# Patient Record
Sex: Female | Born: 1975 | Race: White | Hispanic: No | Smoking: Current every day smoker
Health system: Southern US, Community
[De-identification: ages and names within clinical notes are randomized; demographics above are authoritative.]

## PROBLEM LIST (undated history)

## (undated) DIAGNOSIS — K8020 Calculus of gallbladder without cholecystitis without obstruction: Secondary | ICD-10-CM

## (undated) DIAGNOSIS — I10 Essential (primary) hypertension: Secondary | ICD-10-CM

## (undated) DIAGNOSIS — D219 Benign neoplasm of connective and other soft tissue, unspecified: Secondary | ICD-10-CM

## (undated) DIAGNOSIS — F17200 Nicotine dependence, unspecified, uncomplicated: Secondary | ICD-10-CM

## (undated) DIAGNOSIS — D649 Anemia, unspecified: Secondary | ICD-10-CM

## (undated) DIAGNOSIS — M1990 Unspecified osteoarthritis, unspecified site: Secondary | ICD-10-CM

## (undated) HISTORY — DX: Unspecified osteoarthritis, unspecified site: M19.90

## (undated) HISTORY — DX: Benign neoplasm of connective and other soft tissue, unspecified: D21.9

## (undated) HISTORY — DX: Nicotine dependence, unspecified, uncomplicated: F17.200

## (undated) HISTORY — DX: Calculus of gallbladder without cholecystitis without obstruction: K80.20

## (undated) HISTORY — DX: Essential (primary) hypertension: I10

---

## 2007-07-01 HISTORY — PX: HERNIA REPAIR: SHX51

## 2019-07-19 ENCOUNTER — Emergency Department (HOSPITAL_COMMUNITY)
Admission: EM | Admit: 2019-07-19 | Discharge: 2019-07-19 | Disposition: A | Discharge status: Home / Self Care | Payer: Self-pay | Attending: Emergency Medicine | Admitting: Emergency Medicine

## 2019-07-19 ENCOUNTER — Other Ambulatory Visit: Payer: Self-pay

## 2019-07-19 DIAGNOSIS — K029 Dental caries, unspecified: Secondary | ICD-10-CM | POA: Insufficient documentation

## 2019-07-19 DIAGNOSIS — K0889 Other specified disorders of teeth and supporting structures: Secondary | ICD-10-CM | POA: Insufficient documentation

## 2019-07-19 MED ORDER — PENICILLIN V POTASSIUM 500 MG PO TABS: 500.0000 mg | ORAL_TABLET | Freq: Four times a day (QID) | ORAL | 0 refills | Status: AC

## 2019-07-19 MED ORDER — OXYCODONE-ACETAMINOPHEN 5-325 MG PO TABS: 1.0000 | ORAL_TABLET | Freq: Four times a day (QID) | ORAL | 0 refills | Status: AC | PRN

## 2019-07-19 MED ORDER — OXYCODONE-ACETAMINOPHEN 5-325 MG PO TABS
1.0000 | ORAL_TABLET | Freq: Once | ORAL | Status: DC
  Filled 2019-07-19: qty 1

## 2019-07-19 MED ORDER — NAPROXEN 500 MG PO TABS: 500.0000 mg | ORAL_TABLET | Freq: Two times a day (BID) | ORAL | 0 refills | Status: DC | PRN

## 2019-07-19 MED ORDER — OXYCODONE-ACETAMINOPHEN 5-325 MG PO TABS: 1.0000 | ORAL_TABLET | Freq: Four times a day (QID) | ORAL | 0 refills | Status: DC | PRN

## 2019-07-19 MED ORDER — PENICILLIN V POTASSIUM 500 MG PO TABS: 500.0000 mg | ORAL_TABLET | Freq: Four times a day (QID) | ORAL | 0 refills | Status: DC

## 2019-07-19 NOTE — Discharge Instructions (Addendum)
Please take antibiotics as prescribed.  Please take Tylenol and naproxen for your pain.  For breakthrough pain, can take the prescribed Percocet.  Return to ER if you develop fever, swelling.  Otherwise, please call your dentist and schedule follow-up as soon as possible.

## 2019-07-19 NOTE — ED Triage Notes (Signed)
Pt c/o left upper dental pain for week.

## 2019-07-19 NOTE — ED Provider Notes (Signed)
[facility] DEPT Provider Note   CSN: [id number] Arrival date & time: 07/19/19  1115     History Chief Complaint  Patient presents with  . Dental Pain    Caelan Yarger is a 44 y.o. female.  Presents to ER with chief complaint ankle pain.  Patient states pain has been going on for approximately 1 week, left upper tooth.  States she has had multiple dental caries, has had similar episodes that resolved after getting tooth pulled, states she does not like her current dentist and wants to go somewhere new.  No fever, no difficulty swallowing, no facial swelling.  Has been taking over-the-counter meds with minimal relief.  Denies any chronic medical problems, denies any allergies to medications.  HPI     No past medical history on file.  There are no problems to display for this patient.    OB History   No obstetric history on file.     No family history on file.  Social History   Tobacco Use  . Smoking status: Not on file  Substance Use Topics  . Alcohol use: Not on file  . Drug use: Not on file    Home Medications Prior to Admission medications   Not on File    Allergies    Patient has no known allergies.  Review of Systems   Review of Systems  Constitutional: Negative for chills and fever.  HENT: Positive for dental problem. Negative for ear pain and sore throat.   Eyes: Negative for pain and visual disturbance.  Respiratory: Negative for cough and shortness of breath.   Cardiovascular: Negative for chest pain and palpitations.  Gastrointestinal: Negative for abdominal pain and vomiting.  Genitourinary: Negative for dysuria and hematuria.  Musculoskeletal: Negative for arthralgias and back pain.  Skin: Negative for color change and rash.  Neurological: Negative for seizures and syncope.  All other systems reviewed and are negative.   Physical Exam Updated Vital Signs BP (!) 170/122 (BP Location: Right Arm)   Pulse  87   Temp 98.3 F (36.8 C) (Oral)   Resp 17   LMP 07/12/2019   SpO2 99%   Physical Exam Constitutional:      Appearance: Normal appearance.  HENT:     Head: Normocephalic and atraumatic.     Nose: Nose normal.     Mouth/Throat:      Comments: No periapical abscess, no erythema, no swelling of mouth, multiple dental caries noted throughout Cardiovascular:     Rate and Rhythm: Normal rate.     Pulses: Normal pulses.  Pulmonary:     Effort: Pulmonary effort is normal. No respiratory distress.  Abdominal:     General: Abdomen is flat.     Palpations: Abdomen is soft.  Musculoskeletal:        General: No deformity or signs of injury.  Skin:    General: Skin is warm.     Capillary Refill: Capillary refill takes less than 2 seconds.  Neurological:     Mental Status: She is alert.  Psychiatric:        Mood and Affect: Mood normal.     ED Results / Procedures / Treatments   Labs (all labs ordered are listed, but only abnormal results are displayed) Labs Reviewed - No data to display  EKG None  Radiology No results found.  Procedures Procedures (including critical care time)  Medications Ordered in ED Medications  oxyCODONE-acetaminophen (PERCOCET/ROXICET) 5-325 MG per tablet 1 tablet (has  no administration in time range)    ED Course  I have reviewed the triage vital signs and the nursing notes.  Pertinent labs & imaging results that were available during my care of the patient were reviewed by me and considered in my medical decision making (see chart for details).    MDM Rules/Calculators/A&P                      44 year old lady presents to ER with dental pain.  Suspect related to dental caries.  No abscess.  Will give Rx for antibiotics as well as short Rx for Percocet.  Recommend close follow-up with her dentist.  She is not able to be seen soon, recommend calling another dentist for close follow-up in the area.  Reviewed precautions, will discharge.     After the discussed management above, the patient was determined to be safe for discharge.  The patient was in agreement with this plan and all questions regarding their care were answered.  ED return precautions were discussed and the patient will return to the ED with any significant worsening of condition.   Final Clinical Impression(s) / ED Diagnoses Final diagnoses:  Pain, dental    Rx / DC Orders ED Discharge Orders    None       Lucrezia Starch, MD 07/19/19 1323

## 2020-07-09 ENCOUNTER — Encounter (HOSPITAL_COMMUNITY): Payer: Self-pay

## 2020-07-09 ENCOUNTER — Other Ambulatory Visit: Payer: Self-pay

## 2020-07-09 ENCOUNTER — Emergency Department (HOSPITAL_COMMUNITY): Payer: Self-pay

## 2020-07-09 ENCOUNTER — Emergency Department (HOSPITAL_COMMUNITY)
Admission: EM | Admit: 2020-07-09 | Discharge: 2020-07-09 | Disposition: A | Discharge status: Home / Self Care | Payer: Self-pay | Attending: Emergency Medicine | Admitting: Emergency Medicine

## 2020-07-09 DIAGNOSIS — D259 Leiomyoma of uterus, unspecified: Secondary | ICD-10-CM

## 2020-07-09 DIAGNOSIS — R519 Headache, unspecified: Secondary | ICD-10-CM | POA: Insufficient documentation

## 2020-07-09 DIAGNOSIS — Z20822 Contact with and (suspected) exposure to covid-19: Secondary | ICD-10-CM

## 2020-07-09 DIAGNOSIS — R197 Diarrhea, unspecified: Secondary | ICD-10-CM

## 2020-07-09 DIAGNOSIS — H938X1 Other specified disorders of right ear: Secondary | ICD-10-CM

## 2020-07-09 DIAGNOSIS — R5383 Other fatigue: Secondary | ICD-10-CM | POA: Insufficient documentation

## 2020-07-09 DIAGNOSIS — H9390 Unspecified disorder of ear, unspecified ear: Secondary | ICD-10-CM | POA: Insufficient documentation

## 2020-07-09 LAB — URINALYSIS, ROUTINE W REFLEX MICROSCOPIC
Bilirubin Urine: NEGATIVE
Glucose, UA: NEGATIVE mg/dL
Hgb urine dipstick: NEGATIVE
Ketones, ur: NEGATIVE mg/dL
Nitrite: NEGATIVE
Protein, ur: NEGATIVE mg/dL
Specific Gravity, Urine: 1.013 (ref 1.005–1.030)
pH: 6 (ref 5.0–8.0)

## 2020-07-09 LAB — I-STAT BETA HCG BLOOD, ED (MC, WL, AP ONLY): I-stat hCG, quantitative: <5 m[IU]/mL (ref <5)

## 2020-07-09 LAB — LIPASE, BLOOD: Lipase: 43 U/L (ref 11–51)

## 2020-07-09 LAB — COMPREHENSIVE METABOLIC PANEL
ALT: 19 U/L (ref 0–44)
AST: 23 U/L (ref 15–41)
Albumin: 4.3 g/dL (ref 3.5–5.0)
Alkaline Phosphatase: 67 U/L (ref 38–126)
Anion gap: 8 (ref 5–15)
BUN: 12 mg/dL (ref 6–20)
CO2: 26 mmol/L (ref 22–32)
Calcium: 9.1 mg/dL (ref 8.9–10.3)
Chloride: 103 mmol/L (ref 98–111)
Creatinine, Ser: 0.66 mg/dL (ref 0.44–1.00)
GFR, Estimated: >60 mL/min (ref >60)
Glucose, Bld: 111 mg/dL — ABNORMAL HIGH (ref 70–99)
Potassium: 4.5 mmol/L (ref 3.5–5.1)
Sodium: 137 mmol/L (ref 135–145)
Total Bilirubin: 0.5 mg/dL (ref 0.3–1.2)
Total Protein: 7.4 g/dL (ref 6.5–8.1)

## 2020-07-09 LAB — CBC WITH DIFFERENTIAL/PLATELET
Abs Immature Granulocytes: 0.02 10*3/uL (ref 0.00–0.07)
Basophils Absolute: 0.1 10*3/uL (ref 0.0–0.1)
Basophils Relative: 1 %
Eosinophils Absolute: 0.2 10*3/uL (ref 0.0–0.5)
Eosinophils Relative: 3 %
HCT: 41.3 % (ref 36.0–46.0)
Hemoglobin: 13.6 g/dL (ref 12.0–15.0)
Immature Granulocytes: 0 %
Lymphocytes Relative: 31 %
Lymphs Abs: 2.2 10*3/uL (ref 0.7–4.0)
MCH: 29.9 pg (ref 26.0–34.0)
MCHC: 32.9 g/dL (ref 30.0–36.0)
MCV: 90.8 fL (ref 80.0–100.0)
Monocytes Absolute: 0.6 10*3/uL (ref 0.1–1.0)
Monocytes Relative: 8 %
Neutro Abs: 4.1 10*3/uL (ref 1.7–7.7)
Neutrophils Relative %: 57 %
Platelets: 275 10*3/uL (ref 150–400)
RBC: 4.55 MIL/uL (ref 3.87–5.11)
RDW: 15 % (ref 11.5–15.5)
WBC: 7.1 10*3/uL (ref 4.0–10.5)
nRBC: 0 % (ref 0.0–0.2)

## 2020-07-09 LAB — SARS CORONAVIRUS 2 (TAT 6-24 HRS): SARS Coronavirus 2: NEGATIVE

## 2020-07-09 MED ORDER — LACTATED RINGERS IV BOLUS
1000.0000 mL | Freq: Once | INTRAVENOUS | Status: AC
  Administered 2020-07-09: 1000 mL via INTRAVENOUS

## 2020-07-09 MED ORDER — IOHEXOL 300 MG/ML  SOLN
100.0000 mL | Freq: Once | INTRAMUSCULAR | Status: AC | PRN
  Administered 2020-07-09: 100 mL via INTRAVENOUS

## 2020-07-09 NOTE — ED Provider Notes (Signed)
[facility] DEPT Provider Note   CSN: [id number] Arrival date & time: 07/09/20  3086     History Chief Complaint  Patient presents with  . Headache  . Diarrhea    Rhonda Mann is a 45 y.o. female.  HPI      Rhonda Mann is a 45 y.o. female, patient with no known past medical history, presenting to the ED with abdominal pain for the last 2 days. Pain is cramping, sometimes stabbing, intermittent, 4/10, currently located in the right lower quadrant. Additionally complains of diarrhea, fatigue, mental "fogginess," right-sided headache and right-sided ear congestion. She endorses about 6 episodes of loose stool per day.  No recent antibiotics. No COVID-vaccine. Denies fever/chills, nausea, vomiting, chest pain, cough, shortness of breath, neurologic deficits, neck pain/stiffness, urinary symptoms, hematochezia/melena, or any other complaints.  History reviewed. No pertinent past medical history.  There are no problems to display for this patient.   History reviewed. No pertinent surgical history.   OB History   No obstetric history on file.     No family history on file.  Social History   Tobacco Use  . Smoking status: Never Smoker  . Smokeless tobacco: Never Used  Substance Use Topics  . Alcohol use: Never  . Drug use: Never    Home Medications Prior to Admission medications   Medication Sig Start Date End Date Taking? Authorizing Provider  naproxen (NAPROSYN) 500 MG tablet Take 1 tablet (500 mg total) by mouth 2 (two) times daily as needed. 07/19/19   Lucrezia Starch, MD    Allergies    Patient has no known allergies.  Review of Systems   Review of Systems  Constitutional: Positive for fatigue. Negative for chills and fever.  HENT: Positive for ear pain.   Respiratory: Negative for cough and shortness of breath.   Cardiovascular: Negative for chest pain and leg swelling.  Gastrointestinal: Positive for  abdominal pain and diarrhea. Negative for blood in stool, nausea and vomiting.  Genitourinary: Negative for difficulty urinating, dysuria and flank pain.  Musculoskeletal: Negative for back pain, neck pain and neck stiffness.  Neurological: Positive for headaches. Negative for dizziness, syncope and weakness.  All other systems reviewed and are negative.   Physical Exam Updated Vital Signs BP (!) 162/104   Pulse 75   Temp 98.2 F (36.8 C) (Oral)   Resp 18   SpO2 100%   Physical Exam Vitals and nursing note reviewed.  Constitutional:      General: She is not in acute distress.    Appearance: She is well-developed. She is not diaphoretic.  HENT:     Head: Normocephalic and atraumatic.     Mouth/Throat:     Mouth: Mucous membranes are moist.     Pharynx: Oropharynx is clear.  Eyes:     Conjunctiva/sclera: Conjunctivae normal.  Cardiovascular:     Rate and Rhythm: Normal rate and regular rhythm.     Pulses: Normal pulses.          Radial pulses are 2+ on the right side and 2+ on the left side.     Heart sounds: Normal heart sounds.     Comments: Tactile temperature in the extremities appropriate and equal bilaterally. Pulmonary:     Effort: Pulmonary effort is normal. No respiratory distress.     Breath sounds: Normal breath sounds.  Abdominal:     Palpations: Abdomen is soft.     Tenderness: There is abdominal tenderness. There is no  guarding.       Comments: Very mild tenderness, almost nonspecific.  Musculoskeletal:     Cervical back: Neck supple.     Right lower leg: No edema.     Left lower leg: No edema.  Lymphadenopathy:     Cervical: No cervical adenopathy.  Skin:    General: Skin is warm and dry.  Neurological:     Mental Status: She is alert.     Comments: No noted acute cognitive deficit. Sensation grossly intact to light touch in the extremities.   Grip strengths equal bilaterally.   Strength 5/5 in all extremities.  No gait disturbance.  Coordination  intact.  Cranial nerves III-XII grossly intact.  Handles oral secretions without noted difficulty.  No noted phonation or speech deficit. No facial droop.   Psychiatric:        Mood and Affect: Mood and affect normal.        Speech: Speech normal.        Behavior: Behavior normal.     ED Results / Procedures / Treatments   Labs (all labs ordered are listed, but only abnormal results are displayed) Labs Reviewed  COMPREHENSIVE METABOLIC PANEL - Abnormal; Notable for the following components:      Result Value   Glucose, Bld 111 (*)    All other components within normal limits  URINALYSIS, ROUTINE W REFLEX MICROSCOPIC - Abnormal; Notable for the following components:   APPearance HAZY (*)    Leukocytes,Ua TRACE (*)    Bacteria, UA RARE (*)    All other components within normal limits  SARS CORONAVIRUS 2 (TAT 6-24 HRS)  CBC WITH DIFFERENTIAL/PLATELET  LIPASE, BLOOD  I-STAT BETA HCG BLOOD, ED ([facility], [facility], [facility] ONLY)    EKG None  Radiology CT Abdomen Pelvis W Contrast  Result Date: 07/09/2020 CLINICAL DATA:  Right-sided abdominal pain and diarrhea for 2 days. EXAM: CT ABDOMEN AND PELVIS WITH CONTRAST TECHNIQUE: Multidetector CT imaging of the abdomen and pelvis was performed using the standard protocol following bolus administration of intravenous contrast. CONTRAST:  163mL OMNIPAQUE IOHEXOL 300 MG/ML  SOLN COMPARISON:  None. FINDINGS: Lower chest: The lung bases are clear of acute process. No pleural effusion or pulmonary lesions. The heart is normal in size. No pericardial effusion. The distal esophagus and aorta are unremarkable. Hepatobiliary: No hepatic lesions or intrahepatic biliary dilatation. Gallbladder contains calcified gallstones but no CT findings to suggest acute cholecystitis. Normal caliber and course of the common bile duct. Pancreas: No mass, inflammation or ductal dilatation. Spleen: Normal size. No focal lesions. Small accessory spleen noted. Adrenals/Urinary Tract: The  adrenal glands and kidneys are unremarkable. No renal lesions or renal calculi. No obstructing ureteral calculi. No bladder calculi or mass. Stomach/Bowel: The stomach, duodenum, small bowel and colon unremarkable. No acute inflammatory changes, mass lesions or obstructive findings. The terminal ileum is normal. The appendix is normal. Vascular/Lymphatic: The aorta is normal in caliber. No dissection. The branch vessels are patent. The major venous structures are patent. No mesenteric or retroperitoneal mass or adenopathy. Small scattered lymph nodes are noted. Reproductive: A 5 cm uterine fibroid is noted along the posterior aspect. The endometrium is displaced anteriorly slightly but is normal in thickness. The ovaries are unremarkable. Corpus luteum cyst noted on the left. Other: No pelvic mass or adenopathy. No free pelvic fluid collections. No inguinal mass or adenopathy. Moderate-sized periumbilical abdominal wall hernia containing fat. Musculoskeletal: No significant bony findings. IMPRESSION: 1. No acute abdominal/pelvic findings, mass lesions or adenopathy. 2.  Cholelithiasis without CT findings to suggest acute cholecystitis. 3. 5 cm uterine fibroid. 4. Periumbilical abdominal wall hernia containing fat. Electronically Signed   By: Marijo Sanes M.D.   On: 07/09/2020 09:47    Procedures Procedures (including critical care time)  Medications Ordered in ED Medications  lactated ringers bolus 1,000 mL (0 mLs Intravenous Stopped 07/09/20 1055)  iohexol (OMNIPAQUE) 300 MG/ML solution 100 mL (100 mLs Intravenous Contrast Given 07/09/20 0933)    ED Course  I have reviewed the triage vital signs and the nursing notes.  Pertinent labs & imaging results that were available during my care of the patient were reviewed by me and considered in my medical decision making (see chart for details).  Clinical Course as of 07/09/20 1057  Mon Jul 09, 2020  0759 Bacteria, UA(!): RARE Accompanied by 6-10  squamous epithelials, no urinary complaints, suspected to be contamination. [SJ]    Clinical Course User Index [SJ] Rayland Hamed, Helane Gunther, PA-C   MDM Rules/Calculators/A&P                          Patient presents complaining of abdominal pain, headache, diarrhea. Patient is nontoxic appearing, afebrile, not tachycardic, not tachypneic, not hypotensive, maintains excellent SPO2 on room air, and is in no apparent distress.   I reviewed and interpreted the patient's labs and radiological studies. CT with cholelithiasis without evidence of cholecystitis.  We also discussed the fat-containing umbilical hernia.  She can follow-up with general surgery on these findings, as needed.  Uterine fibroid noted.  Patient does mention she has what amounts to 2 menstrual cycles a month for the last 6 months.  Recommended follow-up with OB/GYN on this matter.  The patient was given instructions for home care as well as return precautions. Patient voices understanding of these instructions, accepts the plan, and is comfortable with discharge.     Vitals:   07/09/20 0841 07/09/20 0944 07/09/20 0954 07/09/20 1030  BP: 139/81  133/87 140/83  Pulse: 62 72 71 64  Resp: 18 18 18 20   Temp:    98 F (36.7 C)  TempSrc:      SpO2: 100% 100% 100% 98%  Patient's blood pressure was noted to be higher than normal.  Patient states when she does not feel well, this makes her blood pressure rise.  Alanee Ting was evaluated in Emergency Department on 07/09/2020 for the symptoms described in the history of present illness. She was evaluated in the context of the global COVID-19 pandemic, which necessitated consideration that the patient might be at risk for infection with the SARS-CoV-2 virus that causes COVID-19. Institutional protocols and algorithms that pertain to the evaluation of patients at risk for COVID-19 are in a state of rapid change based on information released by regulatory bodies including the CDC and  federal and state organizations. These policies and algorithms were followed during the patient's care in the ED.  Final Clinical Impression(s) / ED Diagnoses Final diagnoses:  Congestion of right ear  Diarrhea, unspecified type  Person under investigation for COVID-19  Uterine leiomyoma, unspecified location    Rx / DC Orders ED Discharge Orders    None       Layla Maw 07/09/20 1100    Daleen Bo, MD 07/10/20 [phone]

## 2020-07-09 NOTE — Discharge Instructions (Addendum)
Test Results for COVID-19 pending  You have a test pending for COVID-19.  Results typically return within about 48 hours.  Be sure to check MyChart for updated results.  We recommend isolating yourself until results are received.  Patients who have symptoms consistent with COVID-19 should self isolated for: At least 3 days (72 hours) have passed since recovery, defined as resolution of fever without the use of fever reducing medications and improvement in respiratory symptoms (e.g., cough, shortness of breath), and At least 7 days have passed since symptoms first appeared.  If you have no symptoms, but your test returns positive, recommend isolating for at least 10 days.   General Viral Syndrome Care Instructions:  Your symptoms are likely consistent with a viral illness, which includes the possibility for COVID-19 infection. Viruses do not require or respond to antibiotics. Treatment is symptomatic care and it is important to note that these symptoms may last for 7-14 days.   Hand washing: Wash your hands throughout the day, but especially before and after touching the face, using the restroom, sneezing, coughing, or touching surfaces that have been coughed or sneezed upon. Hydration: Symptoms of most illnesses will be intensified and complicated by dehydration. Dehydration can also extend the duration of symptoms. Drink plenty of fluids and get plenty of rest. You should be drinking at least half a liter of water an hour to stay hydrated. Electrolyte drinks (ex. Gatorade, Powerade, Pedialyte) are also encouraged. You should be drinking enough fluids to make your urine light yellow, almost clear. If this is not the case, you are not drinking enough water. Please note that some of the treatments indicated below will not be effective if you are not adequately hydrated. Diet: Please concentrate on hydration, however, you may introduce food slowly.  Start with a clear liquid diet, progressed to a full  liquid diet, and then bland solids as you are able. Pain or fever: Ibuprofen, Naproxen, or acetaminophen (generic for Tylenol) for pain or fever.  Antiinflammatory medications: Take 600 mg of ibuprofen every 6 hours or 440 mg (over the counter dose) to 500 mg (prescription dose) of naproxen every 12 hours for the next 3 days. After this time, these medications may be used as needed for pain. Take these medications with food to avoid upset stomach. Choose only one of these medications, do not take them together. Acetaminophen (generic for Tylenol): Should you continue to have additional pain while taking the ibuprofen or naproxen, you may add in acetaminophen as needed. Your daily total maximum amount of acetaminophen from all sources should be limited to 4000mg /day for persons without liver problems, or 2000mg /day for those with liver problems. Diarrhea: May use medications such as loperamide (Imodium) or Bismuth subsalicylate (Pepto-Bismol). Zyrtec or Claritin: May add these medication daily to control underlying symptoms of congestion, sneezing, and other signs of allergies.  These medications are available over-the-counter. Generics: Cetirizine (generic for Zyrtec) and loratadine (generic for Claritin). Fluticasone: Use fluticasone (generic for Flonase), as directed, for nasal and sinus congestion.  This medication is available over-the-counter. Congestion: Plain guaifenesin (generic for plain Mucinex) may help relieve congestion. Saline sinus rinses and saline nasal sprays may also help relieve congestion. If you do not have high blood pressure, heart problems, or an allergy to such medications, you may also try phenylephrine or Sudafed. Sore throat: Warm liquids or Chloraseptic spray may help soothe a sore throat. Gargle twice a day with a salt water solution made from a half teaspoon of salt in  a cup of warm water.  Follow up: Follow up with a primary care provider within the next two weeks should  symptoms fail to resolve. Return: Return to the ED for significantly worsening symptoms, shortness of breath, persistent vomiting, large amounts of blood in stool, or any other major concerns.  For prescription assistance, may try using prescription discount sites or apps, such as goodrx.com  Gallstones: There were gallstones noted on the CT scan.  Sometimes, these can start to cause pain.  In these cases, follow-up with the general surgeon is indicated.  Uterine fibroid: There is a uterine fibroid noted on the CT.  This should be followed up upon by OB/GYN.  Call to make an appointment.

## 2020-07-09 NOTE — ED Triage Notes (Addendum)
Pt reports headache, right ear pain, a "foggy" sensation, sore throat, and diarrhea for 2 days.

## 2020-08-06 ENCOUNTER — Encounter: Payer: Self-pay | Admitting: Obstetrics & Gynecology

## 2020-08-06 ENCOUNTER — Other Ambulatory Visit (HOSPITAL_COMMUNITY)
Admission: RE | Admit: 2020-08-06 | Discharge: 2020-08-06 | Disposition: A | Discharge status: Home / Self Care | Payer: Medicaid Other | Source: Ambulatory Visit | Attending: Obstetrics & Gynecology | Admitting: Obstetrics & Gynecology

## 2020-08-06 ENCOUNTER — Other Ambulatory Visit: Payer: Self-pay

## 2020-08-06 ENCOUNTER — Ambulatory Visit (INDEPENDENT_AMBULATORY_CARE_PROVIDER_SITE_OTHER): Payer: Medicaid Other | Admitting: Obstetrics & Gynecology

## 2020-08-06 VITALS — BP 151/83 | HR 79 | Ht 68.5 in | Wt 211.1 lb

## 2020-08-06 DIAGNOSIS — I10 Essential (primary) hypertension: Secondary | ICD-10-CM | POA: Diagnosis not present

## 2020-08-06 DIAGNOSIS — F17200 Nicotine dependence, unspecified, uncomplicated: Secondary | ICD-10-CM | POA: Diagnosis not present

## 2020-08-06 DIAGNOSIS — Z01419 Encounter for gynecological examination (general) (routine) without abnormal findings: Secondary | ICD-10-CM | POA: Diagnosis present

## 2020-08-06 DIAGNOSIS — D252 Subserosal leiomyoma of uterus: Secondary | ICD-10-CM | POA: Diagnosis not present

## 2020-08-06 DIAGNOSIS — Z124 Encounter for screening for malignant neoplasm of cervix: Secondary | ICD-10-CM | POA: Diagnosis not present

## 2020-08-06 DIAGNOSIS — N926 Irregular menstruation, unspecified: Secondary | ICD-10-CM

## 2020-08-06 MED ORDER — HYDROCHLOROTHIAZIDE 25 MG PO TABS: 25.0000 mg | ORAL_TABLET | Freq: Every day | ORAL | 3 refills | Status: DC

## 2020-08-06 MED ORDER — NORETHINDRONE 0.35 MG PO TABS: 1.0000 | ORAL_TABLET | Freq: Every day | ORAL | 3 refills | Status: AC

## 2020-08-06 NOTE — Progress Notes (Signed)
GYNECOLOGY  VISIT  CC:   fibroids  HPI: 45 y.o. [id number] Single White or Caucasian female here for routine exam as she is overdue for her pap smear as well as discussion of fibroid that was noted on CT scan.  FIbroid measured 5cm.  Cycles are irregular and she can have two bleeding episodes in a month.  Cycles are typically 5 days and the first three days are heavy with clotting and cramping.  This is not new.  She typically does not cramp if she is not bleeding.  She was not sure if the fibroid that was found needed any treatment.  We discussed the nature of fibroids (benign) and treatment typically for symptoms and then surgical intervention if conservative treatments do not work.  All questions answered.  CT findings: IMPRESSION: 1. No acute abdominal/pelvic findings, mass lesions or adenopathy. 2. Cholelithiasis without CT findings to suggest acute cholecystitis. 3. 5 cm uterine fibroid. 4. Periumbilical abdominal wall hernia containing fat.  GYNECOLOGIC HISTORY: Patient's last menstrual period was 07/19/2020 (exact date). Contraception: none  Patient Active Problem List   Diagnosis Date Noted  . Hypertension   . Smoker     Past Medical History:  Diagnosis Date  . Gall stones   . Hypertension   . Smoker     Past Surgical History:  Procedure Laterality Date  . CESAREAN SECTION WITH BILATERAL TUBAL LIGATION  2006  . HERNIA REPAIR  2009   with mesh, done in [place]:   No current outpatient medications on file prior to visit.   No current facility-administered medications on file prior to visit.    ALLERGIES: Patient has no known allergies.  Family History  Problem Relation Age of Onset  . Cancer Father   . Hypertension Father     SH:  Single, smoker  Review of Systems  Constitutional: Negative.   Respiratory: Negative.   Cardiovascular: Negative.   Gastrointestinal: Negative.   Genitourinary: Positive for menstrual problem.    PHYSICAL EXAMINATION:     BP (!) 151/83   Pulse 79   Ht 5' 8.5" (1.74 m)   Wt 211 lb 1.6 oz (95.8 kg)   LMP 07/19/2020 (Exact Date)   BMI 31.63 kg/m     General appearance: alert, cooperative and appears stated age Neck: no adenopathy, supple, symmetrical, trachea midline and thyroid normal to inspection and palpation CV:  Regular rate and rhythm Lungs:  clear to auscultation, no wheezes, rales or rhonchi, symmetric air entry Breasts: normal appearance, no masses or tenderness Abdomen: soft, non-tender; bowel sounds normal; no masses,  no organomegaly Lymph:  no inguinal LAD noted  Pelvic: External genitalia:  no lesions              Urethra:  normal appearing urethra with no masses, tenderness or lesions              Bartholins and Skenes: normal                 Vagina: normal appearing vagina with normal color and discharge, no lesions              Cervix: no lesions              Bimanual Exam:  Uterus:  normal size, contour, position, consistency, mobility, non-tender              Adnexa: no mass, fullness, tenderness  Rectovaginal: yes.  Confirms.              Anus:  no lesions  Chaperone, Lyda Perone, RN, was present for exam.  Assessment/Plan: 1. Well woman exam with routine gynecological exam - Cytology - PAP( [facility]) - will start MMG screening after age 61 - colonoscopy screening at age 43 will be initiated - pt declined any vaccines today  2. Primary hypertension - hydrochlorothiazide (HYDRODIURIL) 25 MG tablet; Take 1 tablet (25 mg total) by mouth daily.  Dispense: 30 tablet; Refill: 3 - pt needs to establish care with PCP for additional management  3. Smoker  4. Subserous leiomyoma of uterus - D/w pt benign nature of fibroids and trying to initially treat symptoms.  Do not feel surgery needed at this time.  Pt very comfortable with this plan.  6. Irregular bleeding - norethindrone (MICRONOR) 0.35 MG tablet; Take 1 tablet (0.35 mg total) by mouth daily.   Dispense: 28 tablet; Refill: 3 - recheck 3 months

## 2020-08-08 LAB — CYTOLOGY - PAP
Adequacy: ABSENT
Comment: NEGATIVE
Diagnosis: NEGATIVE
High risk HPV: NEGATIVE

## 2020-08-10 ENCOUNTER — Telehealth: Payer: Self-pay | Admitting: Obstetrics & Gynecology

## 2020-08-10 NOTE — Telephone Encounter (Signed)
Multiple attempts have been made throughout the day to the number on the chart.  After two rings, the number states "call can not be completed at this time".  Will send triage message to see if she can be reached.  She should stop the HCTZ.  It would be very unusual for the micronor to cause these symptoms.

## 2020-08-10 NOTE — Telephone Encounter (Signed)
I've tried to call this pt multiple times today.  Phone number states call cannot be completed after two rings.  Will send triage message to see if pt can be reached.  Thank you.

## 2020-11-19 ENCOUNTER — Encounter (HOSPITAL_COMMUNITY): Payer: Self-pay

## 2020-11-19 ENCOUNTER — Emergency Department (HOSPITAL_COMMUNITY)
Admission: EM | Admit: 2020-11-19 | Discharge: 2020-11-19 | Disposition: A | Discharge status: Home / Self Care | Payer: Self-pay | Attending: Emergency Medicine | Admitting: Emergency Medicine

## 2020-11-19 ENCOUNTER — Other Ambulatory Visit: Payer: Self-pay

## 2020-11-19 ENCOUNTER — Emergency Department (HOSPITAL_COMMUNITY): Payer: Self-pay

## 2020-11-19 DIAGNOSIS — F17210 Nicotine dependence, cigarettes, uncomplicated: Secondary | ICD-10-CM | POA: Insufficient documentation

## 2020-11-19 DIAGNOSIS — Z20822 Contact with and (suspected) exposure to covid-19: Secondary | ICD-10-CM | POA: Insufficient documentation

## 2020-11-19 DIAGNOSIS — Z79899 Other long term (current) drug therapy: Secondary | ICD-10-CM | POA: Insufficient documentation

## 2020-11-19 DIAGNOSIS — R079 Chest pain, unspecified: Secondary | ICD-10-CM | POA: Insufficient documentation

## 2020-11-19 DIAGNOSIS — J069 Acute upper respiratory infection, unspecified: Secondary | ICD-10-CM | POA: Insufficient documentation

## 2020-11-19 DIAGNOSIS — I10 Essential (primary) hypertension: Secondary | ICD-10-CM | POA: Insufficient documentation

## 2020-11-19 LAB — BASIC METABOLIC PANEL
Anion gap: 8 (ref 5–15)
BUN: 12 mg/dL (ref 6–20)
CO2: 24 mmol/L (ref 22–32)
Calcium: 9.4 mg/dL (ref 8.9–10.3)
Chloride: 103 mmol/L (ref 98–111)
Creatinine, Ser: 0.67 mg/dL (ref 0.44–1.00)
GFR, Estimated: >60 mL/min (ref >60)
Glucose, Bld: 102 mg/dL — ABNORMAL HIGH (ref 70–99)
Potassium: 4 mmol/L (ref 3.5–5.1)
Sodium: 135 mmol/L (ref 135–145)

## 2020-11-19 LAB — I-STAT BETA HCG BLOOD, ED (MC, WL, AP ONLY): I-stat hCG, quantitative: <5 m[IU]/mL (ref <5)

## 2020-11-19 LAB — CBC
HCT: 43.7 % (ref 36.0–46.0)
Hemoglobin: 14.3 g/dL (ref 12.0–15.0)
MCH: 30 pg (ref 26.0–34.0)
MCHC: 32.7 g/dL (ref 30.0–36.0)
MCV: 91.6 fL (ref 80.0–100.0)
Platelets: 303 10*3/uL (ref 150–400)
RBC: 4.77 MIL/uL (ref 3.87–5.11)
RDW: 15.4 % (ref 11.5–15.5)
WBC: 9.5 10*3/uL (ref 4.0–10.5)
nRBC: 0 % (ref 0.0–0.2)

## 2020-11-19 LAB — SARS CORONAVIRUS 2 (TAT 6-24 HRS): SARS Coronavirus 2: NEGATIVE

## 2020-11-19 LAB — TROPONIN I (HIGH SENSITIVITY): Troponin I (High Sensitivity): 3 ng/L (ref <18)

## 2020-11-19 MED ORDER — ALBUTEROL SULFATE HFA 108 (90 BASE) MCG/ACT IN AERS
2.0000 | INHALATION_SPRAY | Freq: Once | RESPIRATORY_TRACT | Status: AC
  Administered 2020-11-19: 2 via RESPIRATORY_TRACT
  Filled 2020-11-19: qty 6.7

## 2020-11-19 MED ORDER — DEXAMETHASONE 4 MG PO TABS
10.0000 mg | ORAL_TABLET | Freq: Once | ORAL | Status: AC
  Administered 2020-11-19: 10 mg via ORAL
  Filled 2020-11-19: qty 2

## 2020-11-19 NOTE — ED Provider Notes (Signed)
[facility] DEPT Provider Note   CSN: [id number] Arrival date & time: 11/19/20  1047     History Chief Complaint  Patient presents with  . Chest Pain  . chest congestion    Rhonda Mann is a 45 y.o. female.  The history is provided by the patient.  URI Presenting symptoms: congestion and cough   Presenting symptoms: no ear pain, no fever and no sore throat   Severity:  Mild Onset quality:  Gradual Timing:  Intermittent Progression:  Waxing and waning Chronicity:  New Relieved by:  Nothing Worsened by:  Nothing Associated symptoms: no arthralgias, no headaches, no myalgias, no sinus pain, no sneezing and no swollen glands   Risk factors comment:  Smoker      Past Medical History:  Diagnosis Date  . Gall stones   . Hypertension   . Smoker     Patient Active Problem List   Diagnosis Date Noted  . Hypertension   . Smoker     Past Surgical History:  Procedure Laterality Date  . CESAREAN SECTION WITH BILATERAL TUBAL LIGATION  2006  . HERNIA REPAIR  2009   with mesh, done in [place]     OB History    Gravida  5   Para  5   Term  5   Preterm  0   AB  0   Living  5     SAB  0   IAB  0   Ectopic  0   Multiple  0   Live Births  5           Family History  Problem Relation Age of Onset  . Cancer Father   . Hypertension Father     Social History   Tobacco Use  . Smoking status: Current Every Day Smoker    Packs/day: 0.50    Years: 25.00    Pack years: 12.50    Types: Cigarettes  . Smokeless tobacco: Never Used  Vaping Use  . Vaping Use: Never used  Substance Use Topics  . Alcohol use: Not Currently    Comment: occassionally every 3 months   . Drug use: Never    Home Medications Prior to Admission medications   Medication Sig Start Date End Date Taking? Authorizing Provider  hydrochlorothiazide (HYDRODIURIL) 25 MG tablet Take 1 tablet (25 mg total) by mouth daily. 08/06/20   Megan Salon,  MD  norethindrone (MICRONOR) 0.35 MG tablet Take 1 tablet (0.35 mg total) by mouth daily. 08/06/20   Megan Salon, MD    Allergies    Patient has no known allergies.  Review of Systems   Review of Systems  Constitutional: Negative for chills and fever.  HENT: Positive for congestion. Negative for ear pain, sinus pain, sneezing and sore throat.   Eyes: Negative for pain and visual disturbance.  Respiratory: Positive for cough. Negative for shortness of breath.   Cardiovascular: Negative for chest pain and palpitations.  Gastrointestinal: Negative for abdominal pain and vomiting.  Genitourinary: Negative for dysuria and hematuria.  Musculoskeletal: Negative for arthralgias, back pain and myalgias.  Skin: Negative for color change and rash.  Neurological: Negative for seizures, syncope and headaches.  All other systems reviewed and are negative.   Physical Exam Updated Vital Signs  ED Triage Vitals  Enc Vitals Group     BP 11/19/20 1053 (!) 180/118     Pulse Rate 11/19/20 1053 91     Resp 11/19/20 1053 18  Temp 11/19/20 1053 98.2 F (36.8 C)     Temp Source 11/19/20 1053 Oral     SpO2 11/19/20 1053 97 %     Weight 11/19/20 1053 210 lb (95.3 kg)     Height 11/19/20 1053 5' 8.5" (1.74 m)     Head Circumference --      Peak Flow --      Pain Score 11/19/20 1111 6     Pain Loc --      Pain Edu? --      Excl. in [place]? --     Physical Exam Vitals and nursing note reviewed.  Constitutional:      General: She is not in acute distress.    Appearance: She is well-developed. She is not ill-appearing.  HENT:     Head: Normocephalic and atraumatic.  Eyes:     Extraocular Movements: Extraocular movements intact.     Conjunctiva/sclera: Conjunctivae normal.     Pupils: Pupils are equal, round, and reactive to light.  Cardiovascular:     Rate and Rhythm: Normal rate and regular rhythm.     Pulses:          Radial pulses are 2+ on the right side and 2+ on the left side.      Heart sounds: No murmur heard.   Pulmonary:     Effort: Pulmonary effort is normal. No respiratory distress.     Breath sounds: Normal breath sounds. No decreased breath sounds, wheezing or rhonchi.  Abdominal:     Palpations: Abdomen is soft.     Tenderness: There is no abdominal tenderness.  Musculoskeletal:     Cervical back: Normal range of motion and neck supple.     Right lower leg: No edema.     Left lower leg: No edema.  Skin:    General: Skin is warm and dry.     Capillary Refill: Capillary refill takes less than 2 seconds.  Neurological:     General: No focal deficit present.     Mental Status: She is alert.     ED Results / Procedures / Treatments   Labs (all labs ordered are listed, but only abnormal results are displayed) Labs Reviewed  BASIC METABOLIC PANEL - Abnormal; Notable for the following components:      Result Value   Glucose, Bld 102 (*)    All other components within normal limits  SARS CORONAVIRUS 2 (TAT 6-24 HRS)  CBC  I-STAT BETA HCG BLOOD, ED ([facility], [facility], [facility] ONLY)  TROPONIN I (HIGH SENSITIVITY)    EKG EKG Interpretation  Date/Time:  Monday Nov 19 2020 10:55:51 EDT Ventricular Rate:  86 PR Interval:  175 QRS Duration: 93 QT Interval:  375 QTC Calculation: 449 R Axis:   79 Text Interpretation: Sinus rhythm 12 Lead; Mason-Likar Confirmed by Lennice Sites ([id number]) on 11/19/2020 2:15:03 PM   Radiology DG Chest 2 View  Result Date: 11/19/2020 CLINICAL DATA:  Chest pain and congestion. EXAM: CHEST - 2 VIEW COMPARISON:  02/10/2020 and CT chest 02/10/2020. FINDINGS: Trachea is midline. Heart size normal. Lungs are clear. No pleural fluid. IMPRESSION: No acute findings. Electronically Signed   By: Lorin Picket M.D.   On: 11/19/2020 11:56    Procedures Procedures   Medications Ordered in ED Medications  dexamethasone (DECADRON) tablet 10 mg (10 mg Oral Given 11/19/20 1505)  albuterol (VENTOLIN HFA) 108 (90 Base) MCG/ACT inhaler 2 puff (2 puffs  Inhalation Given 11/19/20 1506)    ED Course  I  have reviewed the triage vital signs and the nursing notes.  Pertinent labs & imaging results that were available during my care of the patient were reviewed by me and considered in my medical decision making (see chart for details).    MDM Rules/Calculators/A&P                          Rhonda Mann is a 45 year old female with history of hypertension and smoking who presents the ED with cough and congestion.  Patient with unremarkable vitals.  EKG shows sinus rhythm.  Troponin was done in triage and is normal.  She is not having any cardiac sounding chest pain.  She has had URI symptoms and allergy symptoms.  Chest x-ray shows no pneumonia.  Otherwise no significant anemia, electrolyte abnormality, kidney injury.  Suspect allergy versus reactive airway process.  Will give a dose of Decadron and albuterol.  Discharged in good condition.  No concern for PE or ACS. Will test for covid.  This chart was dictated using voice recognition software.  Despite best efforts to proofread,  errors can occur which can change the documentation meaning.     Final Clinical Impression(s) / ED Diagnoses Final diagnoses:  Upper respiratory tract infection, unspecified type    Rx / DC Orders ED Discharge Orders    None       Lennice Sites, DO 11/19/20 1541

## 2020-11-19 NOTE — ED Triage Notes (Signed)
Patient c/o intermittent mid chest pain x 2 days and chest congestion x 1 week. Patient states she was taking Claritin, but no relief.

## 2020-11-19 NOTE — ED Provider Notes (Signed)
Emergency Medicine Provider Triage Evaluation Note  Rhonda Mann 45 y.o. female was evaluated in triage.  Pt complains of 1 week of cough, nasal congestion and midsternal chest pain x2 days.  She states it feels more like a soreness.  Chest pain worse when she coughs.  At times, she feels like she has trouble breathing.  She has a history of asthma and does smoke about half a pack of cigarettes a day.  No fevers.  She has been taking Claritin with no improvement.  She has not been vaccinated for COVID.  No COVID exposure that she knows of.   Review of Systems  Positive: Cough, SOB, CP Negative: Fever, abd pain  Physical Exam  BP 134/82   Pulse 70   Temp 98.2 F (36.8 C) (Oral)   Resp 18   Ht 5\' 4"  (1.626 m)   Wt 65.8 kg   SpO2 100%   BMI 24.89 kg/m  Gen:   Awake, no distress   HEENT:  Atraumatic  Resp:  Normal effort.  Lungs clear to auscultation.  No evidence of respiratory distress.  Able speak in full sentences without any difficulty.  No wheezing. Cardiac:  Normal rate  Abd:   Nondistended, nontender  MSK:   Moves extremities without difficulty  Neuro:  Speech clear   Other:      Medical Decision Making  Medically screening exam initiated at 12:40 PM  Appropriate orders placed.  Shalondra Wunschel was informed that the remainder of the evaluation will be completed by another provider, this initial triage assessment does not replace that evaluation. They are counseled that they will need to remain in the ED until the completion of their workup, including full H&P and results of any tests.  Risks of leaving the emergency department prior to completion of treatment were discussed. Patient was advised to inform ED staff if they are leaving before their treatment is complete. The patient acknowledged these risks and time was allowed for questions.     The patient appears stable so that the remainder of the MSE may be completed by another provider.    Clinical Impression   Cough, CP, SOB   Portions of this note were generated with Dragon dictation software. Dictation errors may occur despite best attempts at proofreading.      Volanda Napoleon, PA-C 11/19/20 1241    Lennice Sites, DO 11/19/20 1524

## 2021-02-14 ENCOUNTER — Other Ambulatory Visit: Payer: Self-pay

## 2021-02-14 ENCOUNTER — Encounter (HOSPITAL_COMMUNITY): Payer: Self-pay

## 2021-02-14 ENCOUNTER — Emergency Department (HOSPITAL_COMMUNITY): Payer: Self-pay

## 2021-02-14 ENCOUNTER — Emergency Department (HOSPITAL_COMMUNITY)
Admission: EM | Admit: 2021-02-14 | Discharge: 2021-02-14 | Disposition: A | Discharge status: Home / Self Care | Payer: Self-pay | Attending: Emergency Medicine | Admitting: Emergency Medicine

## 2021-02-14 DIAGNOSIS — K429 Umbilical hernia without obstruction or gangrene: Secondary | ICD-10-CM

## 2021-02-14 DIAGNOSIS — F17210 Nicotine dependence, cigarettes, uncomplicated: Secondary | ICD-10-CM | POA: Insufficient documentation

## 2021-02-14 DIAGNOSIS — R102 Pelvic and perineal pain: Secondary | ICD-10-CM | POA: Insufficient documentation

## 2021-02-14 DIAGNOSIS — I10 Essential (primary) hypertension: Secondary | ICD-10-CM | POA: Insufficient documentation

## 2021-02-14 DIAGNOSIS — K8020 Calculus of gallbladder without cholecystitis without obstruction: Secondary | ICD-10-CM

## 2021-02-14 DIAGNOSIS — K449 Diaphragmatic hernia without obstruction or gangrene: Secondary | ICD-10-CM

## 2021-02-14 DIAGNOSIS — Z79899 Other long term (current) drug therapy: Secondary | ICD-10-CM | POA: Insufficient documentation

## 2021-02-14 LAB — CBC
HCT: 40.4 % (ref 36.0–46.0)
Hemoglobin: 13.3 g/dL (ref 12.0–15.0)
MCH: 29.8 pg (ref 26.0–34.0)
MCHC: 32.9 g/dL (ref 30.0–36.0)
MCV: 90.6 fL (ref 80.0–100.0)
Platelets: 327 10*3/uL (ref 150–400)
RBC: 4.46 MIL/uL (ref 3.87–5.11)
RDW: 14.5 % (ref 11.5–15.5)
WBC: 9.6 10*3/uL (ref 4.0–10.5)
nRBC: 0 % (ref 0.0–0.2)

## 2021-02-14 LAB — COMPREHENSIVE METABOLIC PANEL
ALT: 27 U/L (ref 0–44)
AST: 27 U/L (ref 15–41)
Albumin: 4 g/dL (ref 3.5–5.0)
Alkaline Phosphatase: 64 U/L (ref 38–126)
Anion gap: 8 (ref 5–15)
BUN: 11 mg/dL (ref 6–20)
CO2: 23 mmol/L (ref 22–32)
Calcium: 9.2 mg/dL (ref 8.9–10.3)
Chloride: 104 mmol/L (ref 98–111)
Creatinine, Ser: 0.6 mg/dL (ref 0.44–1.00)
GFR, Estimated: >60 mL/min (ref >60)
Glucose, Bld: 97 mg/dL (ref 70–99)
Potassium: 4.3 mmol/L (ref 3.5–5.1)
Sodium: 135 mmol/L (ref 135–145)
Total Bilirubin: 0.4 mg/dL (ref 0.3–1.2)
Total Protein: 7.2 g/dL (ref 6.5–8.1)

## 2021-02-14 LAB — URINALYSIS, ROUTINE W REFLEX MICROSCOPIC
Bilirubin Urine: NEGATIVE
Glucose, UA: NEGATIVE mg/dL
Hgb urine dipstick: NEGATIVE
Ketones, ur: NEGATIVE mg/dL
Leukocytes,Ua: NEGATIVE
Nitrite: NEGATIVE
Protein, ur: NEGATIVE mg/dL
Specific Gravity, Urine: 1.01 (ref 1.005–1.030)
pH: 8 (ref 5.0–8.0)

## 2021-02-14 LAB — I-STAT BETA HCG BLOOD, ED (MC, WL, AP ONLY): I-stat hCG, quantitative: <5 m[IU]/mL (ref <5)

## 2021-02-14 LAB — LIPASE, BLOOD: Lipase: 46 U/L (ref 11–51)

## 2021-02-14 MED ORDER — FENTANYL CITRATE (PF) 100 MCG/2ML IJ SOLN
50.0000 ug | Freq: Once | INTRAMUSCULAR | Status: AC
Start: 2021-02-14 — End: 2021-02-14
  Administered 2021-02-14: 50 ug via INTRAVENOUS
  Filled 2021-02-14: qty 2

## 2021-02-14 MED ORDER — IOHEXOL 350 MG/ML SOLN
75.0000 mL | Freq: Once | INTRAVENOUS | Status: AC | PRN
  Administered 2021-02-14: 75 mL via INTRAVENOUS

## 2021-02-14 NOTE — ED Provider Notes (Signed)
[facility] DEPT Provider Note   CSN: [id number] Arrival date & time: 02/14/21  1028     History Chief Complaint  Patient presents with   Abdominal Pain    Rhonda Mann is a 45 y.o. female.   Abdominal Pain Associated symptoms: no chest pain, no shortness of breath and no vomiting   Patient with abdominal pain.  Has had a recurrent hernia over around the last month.  Around 15 years ago had a umbilical hernia repair with mesh.  States over the last month she has had a new bump show up.  Is on the upper part of the umbilicus.  States it gets painful.  States her abdomen sometimes feels the facility more swollen.  No nausea or vomiting.  No fevers.    Past Medical History:  Diagnosis Date   Gall stones    Hypertension    Smoker     Patient Active Problem List   Diagnosis Date Noted   Hypertension    Smoker     Past Surgical History:  Procedure Laterality Date   CESAREAN SECTION WITH BILATERAL TUBAL LIGATION  2006   HERNIA REPAIR  2009   with mesh, done in [place]     OB History     Gravida  5   Para  5   Term  5   Preterm  0   AB  0   Living  5      SAB  0   IAB  0   Ectopic  0   Multiple  0   Live Births  5           Family History  Problem Relation Age of Onset   Cancer Father    Hypertension Father     Social History   Tobacco Use   Smoking status: Every Day    Packs/day: 0.50    Years: 25.00    Pack years: 12.50    Types: Cigarettes   Smokeless tobacco: Never  Vaping Use   Vaping Use: Never used  Substance Use Topics   Alcohol use: Not Currently    Comment: occassionally every 3 months    Drug use: Never    Home Medications Prior to Admission medications   Medication Sig Start Date End Date Taking? Authorizing Provider  ibuprofen (ADVIL) 200 MG tablet Take 400 mg by mouth every 6 (six) hours as needed for fever, headache or mild pain.   Yes [provider]  OVER THE COUNTER  MEDICATION Apply 1 patch topically daily. Thrive   Yes [provider]  hydrochlorothiazide (HYDRODIURIL) 25 MG tablet Take 1 tablet (25 mg total) by mouth daily. Patient not taking: Reported on 02/14/2021 08/06/20   Megan Salon, MD  norethindrone (MICRONOR) 0.35 MG tablet Take 1 tablet (0.35 mg total) by mouth daily. Patient not taking: Reported on 02/14/2021 08/06/20   Megan Salon, MD    Allergies    Patient has no known allergies.  Review of Systems   Review of Systems  Constitutional:  Negative for appetite change.  HENT:  Negative for congestion.   Respiratory:  Negative for shortness of breath.   Cardiovascular:  Negative for chest pain.  Gastrointestinal:  Positive for abdominal pain. Negative for vomiting.  Genitourinary:  Negative for flank pain.  Skin:  Negative for rash.  Neurological:  Negative for weakness.  Psychiatric/Behavioral:  Negative for confusion.    Physical Exam Updated Vital Signs BP (!) 153/105  Pulse 71   Temp 97.8 F (36.6 C) (Oral)   Resp 18   Ht 5' 8.5" (1.74 m)   Wt 98.4 kg   LMP 01/16/2021 Comment: negative HCG blood test 02-14-2021  SpO2 98%   BMI 32.51 kg/m   Physical Exam Vitals and nursing note reviewed.  HENT:     Head: Normocephalic.  Cardiovascular:     Rate and Rhythm: Normal rate.  Abdominal:     Tenderness: There is abdominal tenderness.     Hernia: A hernia is present. Hernia is present in the umbilical area.     Comments: Likely supraumbilical hernia.  Decreases in size some but does not completely reduce.  No other abdominal tenderness  Skin:    General: Skin is warm.  Neurological:     Mental Status: She is alert.    ED Results / Procedures / Treatments   Labs (all labs ordered are listed, but only abnormal results are displayed) Labs Reviewed  LIPASE, BLOOD  COMPREHENSIVE METABOLIC PANEL  CBC  URINALYSIS, ROUTINE W REFLEX MICROSCOPIC  I-STAT BETA HCG BLOOD, ED ([facility], [facility], [facility] ONLY)     EKG None  Radiology CT ABDOMEN PELVIS W CONTRAST  Result Date: 02/14/2021 CLINICAL DATA:  Abdominal pain, hernia suspected EXAM: CT ABDOMEN AND PELVIS WITH CONTRAST TECHNIQUE: Multidetector CT imaging of the abdomen and pelvis was performed using the standard protocol following bolus administration of intravenous contrast. CONTRAST:  31m OMNIPAQUE IOHEXOL 350 MG/ML SOLN COMPARISON:  CT abdomen/pelvis 07/09/2020 FINDINGS: Lower chest: The lung bases are clear. The imaged heart is unremarkable. Hepatobiliary: The liver is unremarkable. Multiple gallstones are again seen in the gallbladder. There is no evidence of acute cholecystitis. There is no biliary ductal dilatation. Pancreas: Unremarkable. Spleen: Unremarkable. Adrenals/Urinary Tract: The adrenals are unremarkable. The kidneys are unremarkable, with no focal lesion, stone, hydronephrosis, or hydroureter. The bladder is unremarkable. Stomach/Bowel: There is a moderate size hiatal hernia without evidence of obstruction. The stomach is otherwise unremarkable allowing for decompression. There is no abnormal bowel wall thickening or inflammatory change. The appendix is normal. Vascular/Lymphatic: The abdominal aorta is nonaneurysmal. The main portal and splenic veins are patent. There is no abdominal or pelvic lymphadenopathy. Reproductive: The uterus and adnexa are unremarkable. Other: There is no ascites or free air. There is a fat containing umbilical hernia without evidence of incarceration, unchanged Musculoskeletal: There is no acute osseous abnormality. IMPRESSION: 1. No acute pathology in the abdomen or pelvis. 2. New moderate size hiatal hernia without evidence of obstruction. 3. Unchanged fat containing umbilical hernia without evidence of incarceration. 4. Cholelithiasis without evidence of acute cholecystitis. Electronically Signed   By: PValetta MoleM.D.   On: 02/14/2021 13:11    Procedures Procedures   Medications Ordered in  ED Medications  iohexol (OMNIPAQUE) 350 MG/ML injection 75 mL (75 mLs Intravenous Contrast Given 02/14/21 1221)  fentaNYL (SUBLIMAZE) injection 50 mcg (50 mcg Intravenous Given 02/14/21 1257)    ED Course  I have reviewed the triage vital signs and the nursing notes.  Pertinent labs & imaging results that were available during my care of the patient were reviewed by me and considered in my medical decision making (see chart for details).    MDM Rules/Calculators/A&P                           Patient presented with abdominal pain.  Known umbilical hernia.  Has been more painful.  States it is sticking out  more.  It was reduced partially on exam.  CT scan done and did show hernia but appears to be only fat herniated.  We will follow-up general surgery.  Also had hiatal hernia which appeared to be new.  States she does get a lot of reflux.  Also had likely unrelated gallstones.  Can follow-up with general surgery for this also. Final Clinical Impression(s) / ED Diagnoses Final diagnoses:  Umbilical hernia without obstruction and without gangrene  Gallstones  Hiatal hernia    Rx / DC Orders ED Discharge Orders     None        Davonna Belling, MD 02/15/21 1441

## 2021-02-14 NOTE — ED Triage Notes (Signed)
Patient c/o mid abdominal pain and states she has an umbilical hernia that has worsened.

## 2021-05-04 ENCOUNTER — Emergency Department (HOSPITAL_COMMUNITY)
Admission: EM | Admit: 2021-05-04 | Discharge: 2021-05-04 | Disposition: A | Discharge status: Home / Self Care | Payer: Self-pay | Attending: Emergency Medicine | Admitting: Emergency Medicine

## 2021-05-04 ENCOUNTER — Emergency Department (HOSPITAL_COMMUNITY): Payer: Self-pay

## 2021-05-04 ENCOUNTER — Other Ambulatory Visit: Payer: Self-pay

## 2021-05-04 ENCOUNTER — Encounter (HOSPITAL_COMMUNITY): Payer: Self-pay | Admitting: Oncology

## 2021-05-04 DIAGNOSIS — R109 Unspecified abdominal pain: Secondary | ICD-10-CM | POA: Insufficient documentation

## 2021-05-04 DIAGNOSIS — R0789 Other chest pain: Secondary | ICD-10-CM | POA: Insufficient documentation

## 2021-05-04 DIAGNOSIS — S80812A Abrasion, left lower leg, initial encounter: Secondary | ICD-10-CM | POA: Insufficient documentation

## 2021-05-04 DIAGNOSIS — Y9302 Activity, running: Secondary | ICD-10-CM | POA: Insufficient documentation

## 2021-05-04 DIAGNOSIS — W19XXXA Unspecified fall, initial encounter: Secondary | ICD-10-CM

## 2021-05-04 DIAGNOSIS — W010XXA Fall on same level from slipping, tripping and stumbling without subsequent striking against object, initial encounter: Secondary | ICD-10-CM | POA: Insufficient documentation

## 2021-05-04 DIAGNOSIS — Z79899 Other long term (current) drug therapy: Secondary | ICD-10-CM | POA: Insufficient documentation

## 2021-05-04 DIAGNOSIS — I10 Essential (primary) hypertension: Secondary | ICD-10-CM | POA: Insufficient documentation

## 2021-05-04 DIAGNOSIS — M25552 Pain in left hip: Secondary | ICD-10-CM | POA: Insufficient documentation

## 2021-05-04 DIAGNOSIS — F17210 Nicotine dependence, cigarettes, uncomplicated: Secondary | ICD-10-CM | POA: Insufficient documentation

## 2021-05-04 LAB — PREGNANCY, URINE: Preg Test, Ur: NEGATIVE

## 2021-05-04 MED ORDER — KETOROLAC TROMETHAMINE 15 MG/ML IJ SOLN
15.0000 mg | Freq: Once | INTRAMUSCULAR | Status: AC
  Administered 2021-05-04: 15 mg via INTRAMUSCULAR
  Filled 2021-05-04: qty 1

## 2021-05-04 MED ORDER — LIDOCAINE 5 % EX PTCH
1.0000 | MEDICATED_PATCH | CUTANEOUS | Status: DC
  Administered 2021-05-04: 1 via TRANSDERMAL
  Filled 2021-05-04: qty 1

## 2021-05-04 MED ORDER — AMLODIPINE BESYLATE 2.5 MG PO TABS: 2.5000 mg | ORAL_TABLET | Freq: Every day | ORAL | 0 refills | Status: DC

## 2021-05-04 MED ORDER — KETOROLAC TROMETHAMINE 15 MG/ML IJ SOLN: 15.0000 mg | Freq: Once | INTRAMUSCULAR | Status: DC

## 2021-05-04 NOTE — Discharge Instructions (Signed)
You are seen in the ER today after your fall.  Your physical exam and vital signs,  and x-rays were very reassuring.  You did not have any broken bones and there is no concern for internal bleeding at this time.  Your blood pressure was high throughout her stay in the emergency department given your history of high blood pressure that is not treated given prescribed a medication to take every day for blood pressure.  Please follow-up with the clinic listed below for recheck of your blood pressure.  You may use Tylenol, ibuprofen as needed as well as topical pain relief such as Biofreeze, IcyHot, or lidocaine patches for your muscle soreness.  Also recommend heat application or massage as well.  Return to ER with any new severe symptoms.

## 2021-05-04 NOTE — ED Triage Notes (Signed)
Pt reports mechanical fall w/ rolling down hill 2 days ago.  Abrasions noted to left leg.  Pt reports hx of multiple abdominal surgeries and states the entire left side of her body is hurting.

## 2021-05-04 NOTE — ED Provider Notes (Signed)
[facility] DEPT Provider Note   CSN: [id number] Arrival date & time: 05/04/21  0957     History Chief Complaint  Patient presents with   Rhonda Mann is a 45 y.o. female who presents for a left sided body pain 2 days after a fall.  States that she was hurrying out of the house to try to catch her cat that ran at the door when she slipped on the wet leaves and fell hard onto the grass on her left side and slid down a hill.  Denies LOC, blurry or double vision, nausea or vomiting since that time but does endorse progressively worsening left sided chest wall and abdominal pain as well as left-sided hip pain.  She is not been taking her medications at home because she states that Tylenol and ibuprofen upset her stomach.  Came in today because she is becoming more sore and was concerned as she lives alone and does not have anyone who checks on her.  I personally reviewed this patient's medical records.  She is history of hypertension, gallstones, and smoking cigarettes with 1/2 pack a day.  Patient is G5 P5-0-0-5.  LMP 1 month ago.  Patient used to be on HCTZ but stopped taking it over a year ago because she did not like the way it made her feel.  Does not have a PCP and has not followed up.  HPI     Past Medical History:  Diagnosis Date   Gall stones    Hypertension    Smoker     Patient Active Problem List   Diagnosis Date Noted   Hypertension    Smoker     Past Surgical History:  Procedure Laterality Date   CESAREAN SECTION WITH BILATERAL TUBAL LIGATION  2006   HERNIA REPAIR  2009   with mesh, done in [place]     OB History     Gravida  5   Para  5   Term  5   Preterm  0   AB  0   Living  5      SAB  0   IAB  0   Ectopic  0   Multiple  0   Live Births  5           Family History  Problem Relation Age of Onset   Cancer Father    Hypertension Father     Social History   Tobacco Use   Smoking  status: Every Day    Packs/day: 0.50    Years: 25.00    Pack years: 12.50    Types: Cigarettes   Smokeless tobacco: Never  Vaping Use   Vaping Use: Never used  Substance Use Topics   Alcohol use: Not Currently    Comment: occassionally every 3 months    Drug use: Never    Home Medications Prior to Admission medications   Medication Sig Start Date End Date Taking? Authorizing Provider  amLODipine (NORVASC) 2.5 MG tablet Take 1 tablet (2.5 mg total) by mouth daily. 05/04/21  Yes Solomiya Pascale R, PA-C  OVER THE COUNTER MEDICATION Apply 1 patch topically daily. Thrive   Yes [provider]  hydrochlorothiazide (HYDRODIURIL) 25 MG tablet Take 1 tablet (25 mg total) by mouth daily. Patient not taking: No sig reported 08/06/20   Megan Salon, MD  norethindrone (MICRONOR) 0.35 MG tablet Take 1 tablet (0.35 mg total) by mouth daily. Patient not taking: No  sig reported 08/06/20   Megan Salon, MD    Allergies    Patient has no known allergies.  Review of Systems   Review of Systems  Constitutional: Negative.   HENT: Negative.    Respiratory: Negative.    Cardiovascular: Negative.   Gastrointestinal: Negative.   Genitourinary: Negative.   Musculoskeletal:  Positive for myalgias.  Neurological: Negative.    Physical Exam Updated Vital Signs BP (!) 168/113   Pulse 76   Temp 98.5 F (36.9 C) (Oral)   Resp 18   Ht 5' 8.5" (1.74 m)   Wt 97.1 kg   LMP 03/04/2021 (Approximate)   SpO2 98%   BMI 32.07 kg/m   Physical Exam Vitals and nursing note reviewed.  Constitutional:      Appearance: She is obese. She is diaphoretic. She is not ill-appearing or toxic-appearing.  HENT:     Head: Normocephalic and atraumatic.     Nose: Nose normal. No congestion.     Mouth/Throat:     Mouth: Mucous membranes are moist.     Pharynx: Oropharynx is clear. Uvula midline. No oropharyngeal exudate or posterior oropharyngeal erythema.     Tonsils: No tonsillar exudate.  Eyes:      General: Lids are normal. Vision grossly intact.        Right eye: No discharge.        Left eye: No discharge.     Extraocular Movements: Extraocular movements intact.     Conjunctiva/sclera: Conjunctivae normal.     Pupils: Pupils are equal, round, and reactive to light.  Neck:     Trachea: Trachea and phonation normal.  Cardiovascular:     Rate and Rhythm: Normal rate and regular rhythm.     Pulses: Normal pulses.     Heart sounds: Normal heart sounds. No murmur heard. Pulmonary:     Effort: Pulmonary effort is normal. No tachypnea, bradypnea, accessory muscle usage, prolonged expiration or respiratory distress.     Breath sounds: Normal breath sounds. No wheezing or rales.  Chest:     Chest wall: Tenderness present. No mass, lacerations, deformity, swelling, crepitus or edema.    Abdominal:     General: Bowel sounds are normal. There is no distension.     Palpations: Abdomen is soft.     Tenderness: There is no abdominal tenderness. There is no right CVA tenderness, left CVA tenderness, guarding or rebound.  Musculoskeletal:        General: No deformity.     Cervical back: Normal range of motion and neck supple. No edema, rigidity, tenderness, bony tenderness or crepitus. No pain with movement, spinous process tenderness or muscular tenderness.     Thoracic back: Spasms and tenderness present. No bony tenderness.     Lumbar back: Normal. No bony tenderness.       Back:     Right hip: Normal.     Left hip: Tenderness present. No bony tenderness or crepitus. Normal range of motion.     Right upper leg: Normal.     Left upper leg: Normal.     Right knee: Normal.     Left knee: Normal. No bony tenderness. Normal range of motion. No tenderness.     Instability Tests: Anterior drawer test negative. Posterior drawer test negative.     Right lower leg: Normal. No edema.     Left lower leg: No bony tenderness. No edema.     Right ankle: Normal.     Right Achilles Tendon: Normal.  Left ankle: Normal.     Left Achilles Tendon: Normal.       Legs:  Lymphadenopathy:     Cervical: No cervical adenopathy.  Skin:    General: Skin is warm.     Capillary Refill: Capillary refill takes less than 2 seconds.     Findings: Abrasion present.  Neurological:     General: No focal deficit present.     Mental Status: She is alert and oriented to person, place, and time. Mental status is at baseline.  Psychiatric:        Attention and Perception: Attention normal.        Mood and Affect: Mood is anxious.     Comments: Patient appears very anxious, is mildly diaphoretic, very fidgety.    ED Results / Procedures / Treatments   Labs (all labs ordered are listed, but only abnormal results are displayed) Labs Reviewed  PREGNANCY, URINE    EKG None  Radiology DG Ribs Unilateral W/Chest Left  Result Date: 05/04/2021 CLINICAL DATA:  45 year old female with a history of fall EXAM: LEFT RIBS AND CHEST - 3+ VIEW COMPARISON:  11/19/2020 FINDINGS: Cardiomediastinal silhouette unchanged in size and contour. No evidence of central vascular congestion. No interlobular septal thickening. No pneumothorax or pleural effusion. Coarsened interstitial markings, with no confluent airspace disease. No acute displaced fracture. Degenerative changes of the spine. IMPRESSION: Negative for acute displaced rib fracture. Electronically Signed   By: Corrie Mckusick D.O.   On: 05/04/2021 12:48   DG Hip Unilat W or Wo Pelvis 2-3 Views Left  Result Date: 05/04/2021 CLINICAL DATA:  Mechanical fall with rolling down a hill 2 days ago. Anterior left hip pain. EXAM: DG HIP (WITH OR WITHOUT PELVIS) 2-3V LEFT COMPARISON:  CT abdomen pelvis 02/14/2021 FINDINGS: There is no evidence of hip fracture or dislocation. Faint 1.5 cm sclerotic lesion at the femoral neck corresponds to the benign bone island better appreciated on prior CT 02/14/2021. 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 IMPRESSION: No acute  osseous abnormality of the left hip. Electronically Signed   By: Ileana Roup M.D.   On: 05/04/2021 12:52    Procedures Procedures   Medications Ordered in ED Medications  lidocaine (LIDODERM) 5 % 1 patch (1 patch Transdermal Patch Applied 05/04/21 1326)  ketorolac (TORADOL) 15 MG/ML injection 15 mg (15 mg Intramuscular Given 05/04/21 1120)    ED Course  I have reviewed the triage vital signs and the nursing notes.  Pertinent labs & imaging results that were available during my care of the patient were reviewed by me and considered in my medical decision making (see chart for details).    MDM Rules/Calculators/A&P                         45 year old female presents 48 hours after mechanical fall.  Hypertensive on intake to 175/120.  Vital signs otherwise normal.  Cardiopulmonary exam is normal, abdominal exam is benign.  Chest wall with left-sided anterolateral rib tenderness to palpation without skin changes or deformity.  No crepitus.  Full range of motion of the shoulders, wrists, and elbows bilaterally without pain.  Tenderness palpation of the left hip and over the abrasions of the left lower leg.  Patient is ambulatory in the emergency department.  She does appear extremely anxious and when her mask was removed to perform oropharyngeal exam patient is diaphoretic in the face.  She states that this is secondary to her anxiety and her nervousness.  When questioned about her fidgety behavior and her diaphoresis as well as her elevated blood pressure she states she is just nervous.  States that she lives alone and does not have anyone who is harming her and does not feel unsafe in her home or her external relationships.  Will obtain plain films of the ribs on the left with the chest as well as the left hip with pelvis.  Toradol offered as patient refusing oral pain medication.  If BP does not improve after patient has calmed down will consider basic laboratory studies for severity of  hypertension with BP 200/110 at the bedside.  Patient remained hypertensive throughout her stay in the emergency department; recommended laboratory studies to evaluate for possible endorgan injury but patient refused.  Did discuss her history of antihypertensive medication with HCTZ in the past which she is noncompliant with.  She is requesting prescription for alternate medication.  Will discharge with low-dose amlodipine and recommend outpatient follow-up with the [facility].  Plain films negative for acute abnormality on the rib/chest or hip/pelvis.  Suspect muscular injury and contusion from her fall.  May use topical analgesia and Lidoderm patch as needed as well as Tylenol/ibuprofen.  No further work-up warranted in the ER at this time.  [place] voiced understanding of her medical evaluation and treatment plan.  Each of her questions was answered to her expressed satisfaction.  Return precautions were given.  Patient is well-appearing, stable, and appropriate for discharge at this time.  This chart was dictated using voice recognition software, Dragon. Despite the best efforts of this provider to proofread and correct errors, errors may still occur which can change documentation meaning.   Final Clinical Impression(s) / ED Diagnoses Final diagnoses:  Fall, initial encounter    Rx / DC Orders ED Discharge Orders          Ordered    amLODipine (NORVASC) 2.5 MG tablet  Daily        05/04/21 [address] Gypsy Balsam, PA-C 05/04/21 1333    Dorie Rank, MD 05/05/21 [phone]

## 2021-08-06 ENCOUNTER — Encounter (HOSPITAL_COMMUNITY): Payer: Self-pay

## 2021-08-06 ENCOUNTER — Emergency Department (HOSPITAL_COMMUNITY)
Admission: EM | Admit: 2021-08-06 | Discharge: 2021-08-06 | Disposition: A | Discharge status: Home / Self Care | Payer: Worker's Compensation | Attending: Emergency Medicine | Admitting: Emergency Medicine

## 2021-08-06 ENCOUNTER — Other Ambulatory Visit: Payer: Self-pay

## 2021-08-06 DIAGNOSIS — T300 Burn of unspecified body region, unspecified degree: Secondary | ICD-10-CM

## 2021-08-06 DIAGNOSIS — Z23 Encounter for immunization: Secondary | ICD-10-CM | POA: Insufficient documentation

## 2021-08-06 DIAGNOSIS — X19XXXA Contact with other heat and hot substances, initial encounter: Secondary | ICD-10-CM | POA: Diagnosis not present

## 2021-08-06 DIAGNOSIS — T23121A Burn of first degree of single right finger (nail) except thumb, initial encounter: Secondary | ICD-10-CM | POA: Insufficient documentation

## 2021-08-06 DIAGNOSIS — L02511 Cutaneous abscess of right hand: Secondary | ICD-10-CM | POA: Diagnosis not present

## 2021-08-06 DIAGNOSIS — L089 Local infection of the skin and subcutaneous tissue, unspecified: Secondary | ICD-10-CM

## 2021-08-06 DIAGNOSIS — T23021A Burn of unspecified degree of single right finger (nail) except thumb, initial encounter: Secondary | ICD-10-CM | POA: Diagnosis present

## 2021-08-06 MED ORDER — CEPHALEXIN 500 MG PO CAPS
500.0000 mg | ORAL_CAPSULE | Freq: Once | ORAL | Status: AC
  Administered 2021-08-06: 500 mg via ORAL
  Filled 2021-08-06: qty 1

## 2021-08-06 MED ORDER — DOXYCYCLINE HYCLATE 100 MG PO CAPS: 100.0000 mg | ORAL_CAPSULE | Freq: Two times a day (BID) | ORAL | 0 refills | Status: DC

## 2021-08-06 MED ORDER — ONDANSETRON 4 MG PO TBDP: 4.0000 mg | ORAL_TABLET | Freq: Three times a day (TID) | ORAL | 0 refills | Status: DC | PRN

## 2021-08-06 MED ORDER — OXYCODONE-ACETAMINOPHEN 5-325 MG PO TABS
1.0000 | ORAL_TABLET | Freq: Once | ORAL | Status: AC
  Administered 2021-08-06: 1 via ORAL
  Filled 2021-08-06: qty 1

## 2021-08-06 MED ORDER — OXYCODONE-ACETAMINOPHEN 5-325 MG PO TABS: 1.0000 | ORAL_TABLET | Freq: Four times a day (QID) | ORAL | 0 refills | Status: DC | PRN

## 2021-08-06 MED ORDER — DOXYCYCLINE HYCLATE 100 MG PO TABS
100.0000 mg | ORAL_TABLET | Freq: Once | ORAL | Status: AC
  Administered 2021-08-06: 100 mg via ORAL
  Filled 2021-08-06: qty 1

## 2021-08-06 MED ORDER — TETANUS-DIPHTH-ACELL PERTUSSIS 5-2.5-18.5 LF-MCG/0.5 IM SUSY
0.5000 mL | PREFILLED_SYRINGE | Freq: Once | INTRAMUSCULAR | Status: AC
  Administered 2021-08-06: 0.5 mL via INTRAMUSCULAR
  Filled 2021-08-06: qty 0.5

## 2021-08-06 MED ORDER — IBUPROFEN 200 MG PO TABS
600.0000 mg | ORAL_TABLET | Freq: Once | ORAL | Status: AC
  Administered 2021-08-06: 600 mg via ORAL
  Filled 2021-08-06: qty 3

## 2021-08-06 MED ORDER — CEPHALEXIN 500 MG PO CAPS: 500.0000 mg | ORAL_CAPSULE | Freq: Four times a day (QID) | ORAL | 0 refills | Status: AC

## 2021-08-06 MED ORDER — BUPIVACAINE HCL (PF) 0.5 % IJ SOLN
10.0000 mL | Freq: Once | INTRAMUSCULAR | Status: AC
Start: 2021-08-06 — End: 2021-08-06
  Administered 2021-08-06: 10 mL
  Filled 2021-08-06: qty 30

## 2021-08-06 NOTE — ED Notes (Signed)
I provided reinforced discharge education based off of discharge instructions. Pt acknowledged and understood my education. Pt had no further questions/concerns for provider/myself.  °

## 2021-08-06 NOTE — Discharge Instructions (Addendum)
If you develop fevers, begin to feel generally unwell or have any new or concerning symptoms please seek additional medical care and evaluation. Please call the hand specialist tomorrow to set up a follow-up appointment. Please use warm compresses every hour if possible to help allow for continued drainage.  Please keep your wounds clean and dry.  Please keep them covered.  They may continue to ooze and drain this is expected.  Keflex is an antibiotic to help treat infection. Doxycycline is an antibiotic to help treat infection. Please take both of these antibiotics as directed. Zofran is a medicine that you can take as needed to help with any nausea or vomiting. Oxycodone-acetaminophen also known as Percocet is a narcotic pain medication.  All narcotic medications are addictive.  If your pain is not controlled with elevation, ibuprofen, and Tylenol you can take this as needed for breakthrough pain.   If you are unable to see the hand specialist you may have worsening infection with complications.    You may have diarrhea from the antibiotics.  It is very important that you continue to take the antibiotics even if you get diarrhea unless a medical professional tells you that you may stop taking them.  If you stop too early the bacteria you are being treated for will become stronger and you may need different, more powerful antibiotics that have more side effects and worsening diarrhea.  Please stay well hydrated and consider probiotics as they may decrease the severity of your diarrhea.  Please be aware that if you take any hormonal contraception (birth control pills, nexplanon, the ring, etc) that your birth control will not work while you are taking antibiotics and you need to use back up protection as directed on the birth control medication information insert.   Today you received medications that may make you sleepy or impair your ability to make decisions.  For the next 24 hours please do not  drive, operate heavy machinery, care for a small child with out another adult present, or perform any activities that may cause harm to you or someone else if you were to fall asleep or be impaired.   You are being prescribed a medication which may make you sleepy. Please follow up of listed precautions for at least 24 hours after taking one dose.  Please take Ibuprofen (Advil, motrin) and Tylenol (acetaminophen) to relieve your pain.    You may take up to 600 MG (3 pills) of normal strength ibuprofen every 8 hours as needed.   You make take tylenol, up to 1,000 mg (two extra strength pills) every 8 hours as needed.   It is safe to take ibuprofen and tylenol at the same time as they work differently.   Do not take more than 3,000 mg tylenol in a 24 hour period (not more than one dose every 8 hours.  Please check all medication labels as many medications such as pain and cold medications may contain tylenol.  Do not drink alcohol while taking these medications.  Do not take other NSAID'S while taking ibuprofen (such as aleve or naproxen).  Please take ibuprofen with food to decrease stomach upset.

## 2021-08-06 NOTE — ED Provider Triage Note (Signed)
Emergency Medicine Provider Triage Evaluation Note  Rhonda Mann , a 46 y.o. female  was evaluated in triage.  Pt complains of an infection in her right second finger.  Patient states that she burned it on the abdomen few days ago.  Since then its been draining purulent material and has been increasing more painful and swollen.  Review of Systems  Positive:  Negative: See above   Physical Exam  BP (!) 170/104 (BP Location: Right Arm)    Pulse 92    Temp 98 F (36.7 C) (Oral)    Ht 5' 8.5" (1.74 m)    Wt 95.3 kg    SpO2 100%    BMI 31.47 kg/m  Gen:   Awake, no distress   Resp:  Normal effort  MSK:   Moves extremities without difficulty  Other:  Swelling, tenderness, and evidence of purulence over the second right distal finger.  Medical Decision Making  Medically screening exam initiated at 1:01 PM.  Appropriate orders placed.  Rhonda Mann was informed that the remainder of the evaluation will be completed by another provider, this initial triage assessment does not replace that evaluation, and the importance of remaining in the ED until their evaluation is complete.     Rhonda Mann, [place] 08/06/21 1303

## 2021-08-06 NOTE — ED Triage Notes (Signed)
"  Burn to right index finger on Thursday and it is painful and now swelling like it is infected" per pt

## 2021-08-06 NOTE — ED Provider Notes (Signed)
[facility] DEPT Provider Note   CSN: [id number] Arrival date & time: 08/06/21  1251     History  Chief Complaint  Patient presents with   Hand Burn    Rhonda Mann is a 46 y.o. female who presents today for evaluation of a finger infection. She states that days ago she developed taking something out of the left hand the pot holder slipped causing her to burn her right index finger.  She has been soaking it, putting hydrogen peroxide on it, and tried to poke the blister open with a needle.  After that the pain got worse.  She denies any fevers.  Her finger is swollen.   Unknown last tdap.   HPI     Home Medications Prior to Admission medications   Medication Sig Start Date End Date Taking? Authorizing Provider  cephALEXin (KEFLEX) 500 MG capsule Take 1 capsule (500 mg total) by mouth 4 (four) times daily for 10 days. 08/06/21 08/16/21 Yes Lorin Glass, PA-C  doxycycline (VIBRAMYCIN) 100 MG capsule Take 1 capsule (100 mg total) by mouth 2 (two) times daily. 08/06/21  Yes Lorin Glass, PA-C  ondansetron (ZOFRAN-ODT) 4 MG disintegrating tablet Take 1 tablet (4 mg total) by mouth every 8 (eight) hours as needed for nausea or vomiting. 08/06/21  Yes Lorin Glass, PA-C  oxyCODONE-acetaminophen (PERCOCET/ROXICET) 5-325 MG tablet Take 1 tablet by mouth every 6 (six) hours as needed for severe pain. 08/06/21  Yes Lorin Glass, PA-C  amLODipine (NORVASC) 2.5 MG tablet Take 1 tablet (2.5 mg total) by mouth daily. 05/04/21   Sponseller, Gypsy Balsam, PA-C  hydrochlorothiazide (HYDRODIURIL) 25 MG tablet Take 1 tablet (25 mg total) by mouth daily. Patient not taking: No sig reported 08/06/20   Megan Salon, MD  norethindrone (MICRONOR) 0.35 MG tablet Take 1 tablet (0.35 mg total) by mouth daily. Patient not taking: No sig reported 08/06/20   Megan Salon, MD  OVER THE COUNTER MEDICATION Apply 1 patch topically daily. Thrive    [provider]      Allergies    Patient has no known allergies.     Physical Exam Updated Vital Signs BP (!) 152/113 (BP Location: Left Arm)    Pulse 100    Temp 98.3 F (36.8 C) (Oral)    Resp 18    Ht 5' 8.5" (1.74 m)    Wt 95.3 kg    SpO2 100%    BMI 31.47 kg/m  Physical Exam Vitals and nursing note reviewed.  Constitutional:      General: She is not in acute distress.    Appearance: She is not ill-appearing.  HENT:     Head: Atraumatic.  Eyes:     Conjunctiva/sclera: Conjunctivae normal.  Cardiovascular:     Rate and Rhythm: Normal rate.     Comments: Capillary refill under 2 seconds to right distal index finger Pulmonary:     Effort: Pulmonary effort is normal. No respiratory distress.  Abdominal:     General: There is no distension.  Musculoskeletal:     Cervical back: Normal range of motion and neck supple.     Comments: Patient is able to flex the right second finger and extend it.  There is some pain with flexion and extension over the DIP joint.  Swelling is primarily around the distal part of the right index finger.   Skin:    Comments: Please see clinical images.  The right second finger  has diffuse edema with erythema and induration.  There is fluctuance along the nail fold proximally with what appears to be a collection of purulent fluid under.  Finger is TTP.  There is collection of right fluid with what appears to be lint or fibers present over the radial aspect of the distal finger.   Neurological:     Mental Status: She is alert.     Sensory: No sensory deficit.     Comments: Awake and alert, answers all questions appropriately.  Speech is not slurred.  Psychiatric:        Mood and Affect: Mood normal.        Behavior: Behavior normal.           ED Results / Procedures / Treatments   Labs (all labs ordered are listed, but only abnormal results are displayed) Labs Reviewed - No data to display  EKG None  Radiology No results  found.  Procedures .Marland KitchenIncision and Drainage  Date/Time: 08/06/2021 10:28 PM Performed by: Lorin Glass, PA-C Authorized by: Lorin Glass, PA-C   Consent:    Consent obtained:  Verbal   Consent given by:  Patient   Risks, benefits, and alternatives were discussed: yes     Risks discussed:  Bleeding, incomplete drainage, damage to other organs, infection and pain (Need for repeat procedures, damage to other structures)   Alternatives discussed:  No treatment, alternative treatment and referral Universal protocol:    Procedure explained and questions answered to patient or proxy's satisfaction: yes   Location:    Type:  Abscess (Paronychia/felon)   Size:  1cm   Location:  Upper extremity   Upper extremity location:  Finger   Finger location:  R index finger Pre-procedure details:    Skin preparation:  Chlorhexidine Anesthesia:    Anesthesia method:  Nerve block   Block location:  Digital web space blocks   Block needle gauge:  25 G   Block anesthetic:  Bupivacaine 0.5% w/o epi   Block technique:  Web space block   Block injection procedure:  Anatomic landmarks identified, anatomic landmarks palpated, introduced needle, negative aspiration for blood and incremental injection   Block outcome:  Incomplete block Procedure type:    Complexity:  Complex Procedure details:    Incision type: removing dried fluid around intial wound, with minimal debridement and incision along most swollen and fluctuant area.   Incision depth:  Subcutaneous   Wound management:  Debrided   Drainage:  Purulent and bloody   Drainage amount:  Moderate   Packing materials:  None Post-procedure details:    Procedure completion:  Tolerated with difficulty    Medications Ordered in ED Medications  bupivacaine(PF) (MARCAINE) 0.5 % injection 10 mL (10 mLs Infiltration Given by Other 08/06/21 1849)  Tdap (BOOSTRIX) injection 0.5 mL (0.5 mLs Intramuscular Given 08/06/21 1724)  ibuprofen (ADVIL)  tablet 600 mg (600 mg Oral Given 08/06/21 1902)  oxyCODONE-acetaminophen (PERCOCET/ROXICET) 5-325 MG per tablet 1 tablet (1 tablet Oral Given 08/06/21 1902)  cephALEXin (KEFLEX) capsule 500 mg (500 mg Oral Given 08/06/21 1902)  doxycycline (VIBRA-TABS) tablet 100 mg (100 mg Oral Given 08/06/21 1900)    ED Course/ Medical Decision Making/ A&P                           Medical Decision Making Risk OTC drugs. Prescription drug management.   This patient presents to the ED for concern of finger infection this involves an  extensive number of treatment options, and is a complaint that carries with it a high risk of complications and morbidity.  The differential diagnosis includes felon, paronychia, herpetic whitlow, colitis,    Labs and imaging were considered, however given patient denies any significant traumatic mechanism, is afebrile not tachycardic or tachycardia does not appear septic they are not indicated.    Medicines ordered and prescription drug management:  I ordered medication including  for bupivacaine for digital block, Tdap for prophylaxis, Keflex and doxycycline for anti-infectives, Percocet for pain, Advil for pain Reevaluation of the patient after these medicines showed that the patient improved I have reviewed the patients home medicines and have made adjustments as needed    Critical Interventions:  Incision and drainage    Problem List / ED Course:  Finger infection   Reevaluation:  After the interventions noted above, I reevaluated the patient and found that they have :improved   Social Determinants of Health:  Tobacco use   Dispostion:  After consideration of the diagnostic results and the patients response to treatment, I feel that the patent would benefit from discharge home with a trial of antibiotics and close outpatient follow-up.  She is given hand follow-up. She in the ER is able to flex and extend her finger, does not appear to have flexor  tendon tendinitis, does not appear to be septic, have other significant complications at this time. Appropriate wound care is discussed with patient.  [place] PMP is consulted, she is given a prescription for ongoing pain medication, along with continued Keflex and doxycycline.  This patient was seen as a shared visit with Dr. Pearline Cables.   Return precautions were discussed with patient who states their understanding.  At the time of discharge patient denied any unaddressed complaints or concerns.  Patient is agreeable for discharge home.  Note: Portions of this report may have been transcribed using voice recognition software. Every effort was made to ensure accuracy; however, inadvertent computerized transcription errors may be present   Final Clinical Impression(s) / ED Diagnoses Final diagnoses:  Finger infection  Burn    Rx / DC Orders ED Discharge Orders          Ordered    cephALEXin (KEFLEX) 500 MG capsule  4 times daily        08/06/21 1848    doxycycline (VIBRAMYCIN) 100 MG capsule  2 times daily        08/06/21 1848    oxyCODONE-acetaminophen (PERCOCET/ROXICET) 5-325 MG tablet  Every 6 hours PRN        08/06/21 1848    ondansetron (ZOFRAN-ODT) 4 MG disintegrating tablet  Every 8 hours PRN        08/06/21 1848              Lorin Glass, PA-C 08/06/21 [place], Melville A, DO 08/07/21 0129

## 2021-11-17 ENCOUNTER — Emergency Department (HOSPITAL_COMMUNITY): Payer: Self-pay

## 2021-11-17 ENCOUNTER — Emergency Department (HOSPITAL_COMMUNITY)
Admission: EM | Admit: 2021-11-17 | Discharge: 2021-11-17 | Disposition: A | Discharge status: Home / Self Care | Payer: Self-pay | Attending: Emergency Medicine | Admitting: Emergency Medicine

## 2021-11-17 ENCOUNTER — Encounter (HOSPITAL_COMMUNITY): Payer: Self-pay | Admitting: Emergency Medicine

## 2021-11-17 DIAGNOSIS — R059 Cough, unspecified: Secondary | ICD-10-CM | POA: Insufficient documentation

## 2021-11-17 DIAGNOSIS — Z20822 Contact with and (suspected) exposure to covid-19: Secondary | ICD-10-CM | POA: Insufficient documentation

## 2021-11-17 DIAGNOSIS — F17200 Nicotine dependence, unspecified, uncomplicated: Secondary | ICD-10-CM | POA: Insufficient documentation

## 2021-11-17 DIAGNOSIS — I10 Essential (primary) hypertension: Secondary | ICD-10-CM | POA: Insufficient documentation

## 2021-11-17 DIAGNOSIS — R0981 Nasal congestion: Secondary | ICD-10-CM | POA: Insufficient documentation

## 2021-11-17 DIAGNOSIS — R0989 Other specified symptoms and signs involving the circulatory and respiratory systems: Secondary | ICD-10-CM

## 2021-11-17 DIAGNOSIS — Z79899 Other long term (current) drug therapy: Secondary | ICD-10-CM | POA: Insufficient documentation

## 2021-11-17 LAB — GROUP A STREP BY PCR: Group A Strep by PCR: NOT DETECTED

## 2021-11-17 LAB — RESP PANEL BY RT-PCR (FLU A&B, COVID) ARPGX2
Influenza A by PCR: NEGATIVE
Influenza B by PCR: NEGATIVE
SARS Coronavirus 2 by RT PCR: NEGATIVE

## 2021-11-17 MED ORDER — BENZONATATE 100 MG PO CAPS: 100.0000 mg | ORAL_CAPSULE | Freq: Three times a day (TID) | ORAL | 0 refills | Status: DC

## 2021-11-17 MED ORDER — FLUTICASONE PROPIONATE 50 MCG/ACT NA SUSP: 1.0000 | Freq: Every day | NASAL | 2 refills | Status: DC

## 2021-11-17 NOTE — ED Triage Notes (Addendum)
Pt reports sore throat and congestion for 2 days. No fever here, felt like she may have had one at home.

## 2021-11-17 NOTE — ED Provider Notes (Signed)
[facility] DEPT Provider Note   CSN: [id number] Arrival date & time: 11/17/21  9326     History  Chief Complaint  Patient presents with   Sore Throat    Rhonda Mann is a 46 y.o. female.  The history is provided by the patient and medical records. No language interpreter was used.  Sore Throat   46 year old female with significant history of tobacco use, hypertension, presenting complaining of sore throat.  Patient report for the past 2 days she has had sinus congestion, sore throat, sneezing, cough, chest congestion.  Symptoms moderate in severity without any associated fever, shortness of breath, itchy eyes, body aches, or rash.  No recent sick contact, no change in environment.  Patient does smoke approximately 10 cigarettes a day.  She has been taking Zyrtec, and cough and cold medication without relief.  Home Medications Prior to Admission medications   Medication Sig Start Date End Date Taking? Authorizing Provider  amLODipine (NORVASC) 2.5 MG tablet Take 1 tablet (2.5 mg total) by mouth daily. 05/04/21   Sponseller, Gypsy Balsam, PA-C  doxycycline (VIBRAMYCIN) 100 MG capsule Take 1 capsule (100 mg total) by mouth 2 (two) times daily. 08/06/21   Lorin Glass, PA-C  hydrochlorothiazide (HYDRODIURIL) 25 MG tablet Take 1 tablet (25 mg total) by mouth daily. Patient not taking: No sig reported 08/06/20   Megan Salon, MD  norethindrone (MICRONOR) 0.35 MG tablet Take 1 tablet (0.35 mg total) by mouth daily. Patient not taking: No sig reported 08/06/20   Megan Salon, MD  ondansetron (ZOFRAN-ODT) 4 MG disintegrating tablet Take 1 tablet (4 mg total) by mouth every 8 (eight) hours as needed for nausea or vomiting. 08/06/21   Lorin Glass, PA-C  OVER THE COUNTER MEDICATION Apply 1 patch topically daily. Thrive    [provider]  oxyCODONE-acetaminophen (PERCOCET/ROXICET) 5-325 MG tablet Take 1 tablet by mouth every 6 (six) hours  as needed for severe pain. 08/06/21   Lorin Glass, PA-C      Allergies    Patient has no known allergies.    Review of Systems   Review of Systems  All other systems reviewed and are negative.  Physical Exam Updated Vital Signs BP (!) 187/86 (BP Location: Right Arm)   Pulse 74   Temp 97.9 F (36.6 C) (Oral)   Resp 20   Ht '5\' 9"'$  (1.753 m)   Wt 95.3 kg   SpO2 94%   BMI 31.01 kg/m  Physical Exam Vitals and nursing note reviewed.  Constitutional:      General: She is not in acute distress.    Appearance: She is well-developed.  HENT:     Head: Atraumatic.     Nose: Rhinorrhea present.     Mouth/Throat:     Mouth: Mucous membranes are moist. No oral lesions.     Pharynx: Oropharynx is clear. No oropharyngeal exudate or posterior oropharyngeal erythema.  Eyes:     Conjunctiva/sclera: Conjunctivae normal.  Cardiovascular:     Rate and Rhythm: Normal rate and regular rhythm.  Pulmonary:     Effort: Pulmonary effort is normal.     Breath sounds: No wheezing, rhonchi or rales.  Abdominal:     Palpations: Abdomen is soft.     Tenderness: There is no abdominal tenderness.  Musculoskeletal:     Cervical back: Neck supple.  Skin:    Findings: No rash.  Neurological:     Mental Status: She is alert.  Psychiatric:        Mood and Affect: Mood normal.    ED Results / Procedures / Treatments   Labs (all labs ordered are listed, but only abnormal results are displayed) Labs Reviewed  GROUP A STREP BY PCR  RESP PANEL BY RT-PCR (FLU A&B, COVID) [id number]    EKG None  Radiology DG Chest 2 View  Result Date: 11/17/2021 CLINICAL DATA:  46 year old female with cough, sore throat, congestion, shortness of breath for 2 days. Smoker. EXAM: CHEST - 2 VIEW COMPARISON:  Chest CT 02/10/2020. Chest radiographs 05/04/2021 and earlier. FINDINGS: Lung volumes and mediastinal contours are stable and within normal limits. Visualized tracheal air column is within normal limits. Mild  chronic increased interstitial markings in both lungs. No pneumothorax, pulmonary edema, pleural effusion or acute pulmonary opacity. No osseous abnormality identified. Negative visible bowel gas. IMPRESSION: No acute cardiopulmonary abnormality. Stable chronic and likely smoking related increased interstitial lung markings. Electronically Signed   By: Genevie Ann M.D.   On: 11/17/2021 08:05    Procedures Procedures    Medications Ordered in ED Medications - No data to display  ED Course/ Medical Decision Making/ A&P                           Medical Decision Making  BP (!) 187/86 (BP Location: Right Arm)   Pulse 74   Temp 97.9 F (36.6 C) (Oral)   Resp 20   Ht '5\' 9"'$  (1.753 m)   Wt 95.3 kg   SpO2 94%   BMI 31.01 kg/m   7:47 AM This is a 47 year old female with history of tobacco use, who presents with complaints of congestion.  She endorsed having sinus congestion, sneezing, sore throat, chest congestion, cough sometimes productive with with phlegm.  Symptom has been ongoing for the past 2 days and she has tried over-the-counter medication including Zyrtec, and along with cough and cold medication but noticed no improvement.  Patient overall well-appearing, she does not have any fever or body aches to suggest flulike symptoms.  She does have some rhinorrhea on exam her throat exam is normal in appearance, normal phonation, her lungs are clear to auscultation.  Vital signs reveal an elevated blood pressure of 187/86.  Labs obtained independently viewed interpreted by me which are negative for strep pharyngitis and negative for COVID and flu.  Since she does endorse productive cough and is a smoker, I have ordered a chest x-ray as well.  Suspect patient's symptoms likely related to seasonal allergy and less likely to be infectious cause.  I have considered COPD, asthma, PE, ACS but felt that these are less likely based on her presentation.  I have consider more advanced imaging including chest  CTA and labs but felt it is low yield.  9:19 AM Chest x-ray independently visualized and interpreted by me and I agree with radiologist interpretation.  No evidence of pneumonia or other acute pathology.  It does shows evidence of tobacco effect causing interstitial changes.  I discussed this plan with patient.  Will provide patient with symptomatic treatment and will give return precaution.  I spent approximately 8 minutes of discussing about tobacco cessation.  Voiced motivation to quit.  Will provide outpatient resources to help quit smoking.        Final Clinical Impression(s) / ED Diagnoses Final diagnoses:  Chest congestion    Rx / DC Orders ED Discharge Orders  Ordered    benzonatate (TESSALON) 100 MG capsule  Every 8 hours        11/17/21 0921    fluticasone (FLONASE) 50 MCG/ACT nasal spray  Daily        11/17/21 0921              Domenic Moras, PA-C 11/17/21 0930    Lucrezia Starch, MD 11/17/21 1016

## 2022-01-10 ENCOUNTER — Emergency Department (HOSPITAL_COMMUNITY)
Admission: EM | Admit: 2022-01-10 | Discharge: 2022-01-10 | Disposition: A | Discharge status: Home / Self Care | Payer: Medicaid Other | Attending: Emergency Medicine | Admitting: Emergency Medicine

## 2022-01-10 ENCOUNTER — Encounter (HOSPITAL_COMMUNITY): Payer: Self-pay

## 2022-01-10 DIAGNOSIS — Z79899 Other long term (current) drug therapy: Secondary | ICD-10-CM | POA: Insufficient documentation

## 2022-01-10 DIAGNOSIS — I10 Essential (primary) hypertension: Secondary | ICD-10-CM | POA: Insufficient documentation

## 2022-01-10 DIAGNOSIS — K029 Dental caries, unspecified: Secondary | ICD-10-CM

## 2022-01-10 DIAGNOSIS — K0889 Other specified disorders of teeth and supporting structures: Secondary | ICD-10-CM

## 2022-01-10 MED ORDER — AMOXICILLIN-POT CLAVULANATE 875-125 MG PO TABS: 1.0000 | ORAL_TABLET | Freq: Two times a day (BID) | ORAL | 0 refills | Status: DC

## 2022-01-10 MED ORDER — OXYCODONE-ACETAMINOPHEN 5-325 MG PO TABS
1.0000 | ORAL_TABLET | Freq: Once | ORAL | Status: AC
  Administered 2022-01-10: 1 via ORAL
  Filled 2022-01-10: qty 1

## 2022-01-10 NOTE — ED Provider Notes (Signed)
[facility] Provider Note   CSN: [id number] Arrival date & time: 01/10/22  1718     History  Chief Complaint  Patient presents with   Dental Pain    Rhonda Mann is a 46 y.o. female with previous history of known cavities who presents with concern for right upper dental pain for the last several days. She endorses taking ibuprofen twice daily, otherwise reports she has been trying orajel, mouthwash. Denies fever, chills, difficulty swallowing. Previous history of tooth extractions for cavities.   Dental Pain      Home Medications Prior to Admission medications   Medication Sig Start Date End Date Taking? Authorizing Provider  amoxicillin-clavulanate (AUGMENTIN) 875-125 MG tablet Take 1 tablet by mouth every 12 (twelve) hours. 01/10/22  Yes Deneshia Zucker H, PA-C  amLODipine (NORVASC) 2.5 MG tablet Take 1 tablet (2.5 mg total) by mouth daily. 05/04/21   Sponseller, Eugene Garnet R, PA-C  benzonatate (TESSALON) 100 MG capsule Take 1 capsule (100 mg total) by mouth every 8 (eight) hours. 11/17/21   Domenic Moras, PA-C  fluticasone (FLONASE) 50 MCG/ACT nasal spray Place 1 spray into both nostrils daily. 11/17/21 12/17/21  Domenic Moras, PA-C  hydrochlorothiazide (HYDRODIURIL) 25 MG tablet Take 1 tablet (25 mg total) by mouth daily. Patient not taking: No sig reported 08/06/20   Megan Salon, MD  norethindrone (MICRONOR) 0.35 MG tablet Take 1 tablet (0.35 mg total) by mouth daily. Patient not taking: No sig reported 08/06/20   Megan Salon, MD  ondansetron (ZOFRAN-ODT) 4 MG disintegrating tablet Take 1 tablet (4 mg total) by mouth every 8 (eight) hours as needed for nausea or vomiting. 08/06/21   Lorin Glass, PA-C  OVER THE COUNTER MEDICATION Apply 1 patch topically daily. Thrive    [provider]  oxyCODONE-acetaminophen (PERCOCET/ROXICET) 5-325 MG tablet Take 1 tablet by mouth every 6 (six) hours as needed for severe pain. 08/06/21    Lorin Glass, PA-C      Allergies    Patient has no known allergies.    Review of Systems   Review of Systems  HENT:  Positive for dental problem.   All other systems reviewed and are negative.   Physical Exam Updated Vital Signs BP (!) 169/112 (BP Location: Left Arm)   Pulse 92   Temp 98.4 F (36.9 C) (Oral)   Resp 16   LMP 12/07/2021 (Approximate)   SpO2 98%  Physical Exam Vitals and nursing note reviewed.  Constitutional:      General: She is not in acute distress.    Appearance: Normal appearance.  HENT:     Head: Normocephalic and atraumatic.     Mouth/Throat:     Comments: Cavities noted in upper left dentition, no abscess noted. Uvula midline, no PTA. No floor of mouth redness or swelling. Eyes:     General:        Right eye: No discharge.        Left eye: No discharge.  Cardiovascular:     Rate and Rhythm: Normal rate and regular rhythm.  Pulmonary:     Effort: Pulmonary effort is normal. No respiratory distress.  Musculoskeletal:        General: No deformity.  Skin:    General: Skin is warm and dry.  Neurological:     Mental Status: She is alert and oriented to person, place, and time.  Psychiatric:        Mood and Affect: Mood normal.  Behavior: Behavior normal.     ED Results / Procedures / Treatments   Labs (all labs ordered are listed, but only abnormal results are displayed) Labs Reviewed - No data to display  EKG None  Radiology No results found.  Procedures Procedures    Medications Ordered in ED Medications  oxyCODONE-acetaminophen (PERCOCET/ROXICET) 5-325 MG per tablet 1 tablet (1 tablet Oral Given 01/10/22 1847)    ED Course/ Medical Decision Making/ A&P                           Medical Decision Making Risk Prescription drug management.   Is well-appearing patient with history of cavities who presents with left upper dental pain.  I see no evidence of abscess, Ludwig angina, peritonsillar abscess, uvular  deviation, or other abnormality in the.  Patient does have some noted dental cavities as well as some minimal gum redness and irritation.  Discussed I think it is reasonable to try a course of antibiotics, we will help control her pain in the emergency department today, encouraged ibuprofen, Tylenol, continue Orajel and mouthwash and close dental follow-up.  Patient understands agrees to plan, is discharged in stable condition at this time. Final Clinical Impression(s) / ED Diagnoses Final diagnoses:  Pain, dental  Pain due to dental caries    Rx / DC Orders ED Discharge Orders          Ordered    amoxicillin-clavulanate (AUGMENTIN) 875-125 MG tablet  Every 12 hours        01/10/22 1843              Miral Hoopes, Jackalyn Lombard 01/10/22 Levonne Spiller, MD 01/10/22 [phone]

## 2022-01-10 NOTE — Discharge Instructions (Signed)
Please use Tylenol or ibuprofen for pain.  You may use 600 mg ibuprofen every 6 hours or 1000 mg of Tylenol every 6 hours.  You may choose to alternate between the 2.  This would be most effective.  Not to exceed 4 g of Tylenol within 24 hours.  Not to exceed 3200 mg ibuprofen 24 hours.  Continue using Orajel, you can use ice to the affected area.  Please take the entire course of antibiotics that I prescribed, I recommend that you take the antibiotics on a full stomach to prevent stomach upset.  Please follow-up with your dentist at your earliest convenience for definitive treatment of likely cavities.

## 2022-01-10 NOTE — ED Triage Notes (Addendum)
Pt c/o right dental pain.   Reports having a tooth pulled and now pain from the tooth beside it.    10/10 pain A/Ox4 Ambulatory in triage

## 2022-03-01 ENCOUNTER — Emergency Department (HOSPITAL_COMMUNITY)
Admission: EM | Admit: 2022-03-01 | Discharge: 2022-03-01 | Disposition: A | Discharge status: Home / Self Care | Payer: Medicaid Other | Attending: Emergency Medicine | Admitting: Emergency Medicine

## 2022-03-01 ENCOUNTER — Encounter (HOSPITAL_COMMUNITY): Payer: Self-pay

## 2022-03-01 ENCOUNTER — Other Ambulatory Visit: Payer: Self-pay

## 2022-03-01 DIAGNOSIS — S61213A Laceration without foreign body of left middle finger without damage to nail, initial encounter: Secondary | ICD-10-CM | POA: Insufficient documentation

## 2022-03-01 DIAGNOSIS — W228XXA Striking against or struck by other objects, initial encounter: Secondary | ICD-10-CM | POA: Insufficient documentation

## 2022-03-01 DIAGNOSIS — Z23 Encounter for immunization: Secondary | ICD-10-CM | POA: Insufficient documentation

## 2022-03-01 DIAGNOSIS — Z79899 Other long term (current) drug therapy: Secondary | ICD-10-CM | POA: Insufficient documentation

## 2022-03-01 DIAGNOSIS — S61213D Laceration without foreign body of left middle finger without damage to nail, subsequent encounter: Secondary | ICD-10-CM

## 2022-03-01 DIAGNOSIS — X58XXXA Exposure to other specified factors, initial encounter: Secondary | ICD-10-CM | POA: Insufficient documentation

## 2022-03-01 MED ORDER — IBUPROFEN 200 MG PO TABS
600.0000 mg | ORAL_TABLET | Freq: Once | ORAL | Status: AC
Start: 2022-03-01 — End: 2022-03-01
  Administered 2022-03-01: 600 mg via ORAL
  Filled 2022-03-01: qty 3

## 2022-03-01 MED ORDER — TETANUS-DIPHTH-ACELL PERTUSSIS 5-2.5-18.5 LF-MCG/0.5 IM SUSY
0.5000 mL | PREFILLED_SYRINGE | Freq: Once | INTRAMUSCULAR | Status: AC
  Administered 2022-03-01: 0.5 mL via INTRAMUSCULAR
  Filled 2022-03-01: qty 0.5

## 2022-03-01 NOTE — Discharge Instructions (Addendum)
Keep finger clean and dry and keep finger splint in place.   You can remove splint once a day to change dressing and then replace splint.  If after wound heals you are able to normally flex and extend your finger you will not need follow-up but if you are continuing to have difficulty flexing and extending your fingertip you may have had a tendon injury and will need to follow-up with the orthopedic hand specialist on your paperwork today.  Monitor the wound for any signs of infection such as redness, increased swelling or drainage.  If these occur return for reevaluation.

## 2022-03-01 NOTE — ED Triage Notes (Signed)
Pt states that yesterday she cut her L middle finger while opening a can of dog food. Last tetanus update is unknown per pt.

## 2022-03-01 NOTE — ED Triage Notes (Signed)
Left middle finger lac, pt was seen this am for same. Pt states "the new skin popped right open"

## 2022-03-01 NOTE — Discharge Instructions (Signed)
Leave dressing in place for 2 days.  After that you can remove dressing and splint briefly once daily to change the dressing.  It will take time for this to heal but it will heal on its own.  While keep it clean dry.  Monitor for signs of infection such as redness, swelling, increasing pain or drainage.

## 2022-03-01 NOTE — ED Provider Notes (Signed)
[facility] DEPT Provider Note   CSN: [id number] Arrival date & time: 03/01/22  1346     History  Chief Complaint  Patient presents with   Finger Injury    Rhonda Mann is a 46 y.o. female.  Rhonda Mann is a 46 y.o. female who returns to the emergency department for reevaluation of laceration to the left middle finger which I saw the patient for earlier today.  Laceration is > 24 hours old, so wound was not sutured closed and wound was dressed and placed in finger splint and patient was instructed to leave dressing in place for 48 hours, she removed the dressing after 2 hours and saw that the laceration was still open so came in to have it reevaluated.  No bleeding.  Patient was just concerned because she thought it was healing but then when she looked at it was out of the dressing she noted that it was still open.  The history is provided by the patient.       Home Medications Prior to Admission medications   Medication Sig Start Date End Date Taking? Authorizing Provider  amLODipine (NORVASC) 2.5 MG tablet Take 1 tablet (2.5 mg total) by mouth daily. 05/04/21   Sponseller, Eugene Garnet R, PA-C  amoxicillin-clavulanate (AUGMENTIN) 875-125 MG tablet Take 1 tablet by mouth every 12 (twelve) hours. 01/10/22   Prosperi, Christian H, PA-C  benzonatate (TESSALON) 100 MG capsule Take 1 capsule (100 mg total) by mouth every 8 (eight) hours. 11/17/21   Domenic Moras, PA-C  fluticasone (FLONASE) 50 MCG/ACT nasal spray Place 1 spray into both nostrils daily. 11/17/21 12/17/21  Domenic Moras, PA-C  hydrochlorothiazide (HYDRODIURIL) 25 MG tablet Take 1 tablet (25 mg total) by mouth daily. Patient not taking: No sig reported 08/06/20   Megan Salon, MD  norethindrone (MICRONOR) 0.35 MG tablet Take 1 tablet (0.35 mg total) by mouth daily. Patient not taking: No sig reported 08/06/20   Megan Salon, MD  ondansetron (ZOFRAN-ODT) 4 MG disintegrating tablet Take 1  tablet (4 mg total) by mouth every 8 (eight) hours as needed for nausea or vomiting. 08/06/21   Lorin Glass, PA-C  OVER THE COUNTER MEDICATION Apply 1 patch topically daily. Thrive    [provider]  oxyCODONE-acetaminophen (PERCOCET/ROXICET) 5-325 MG tablet Take 1 tablet by mouth every 6 (six) hours as needed for severe pain. 08/06/21   Lorin Glass, PA-C      Allergies    Patient has no known allergies.    Review of Systems   Review of Systems  Skin:  Positive for wound.    Physical Exam Updated Vital Signs BP (!) 170/106 (BP Location: Left Arm)   Pulse 79   Temp 98.3 F (36.8 C) (Oral)   Resp 17   Ht '5\' 8"'$  (1.727 m)   Wt 95.2 kg   SpO2 99%   BMI 31.91 kg/m  Physical Exam Vitals and nursing note reviewed.  Constitutional:      General: She is not in acute distress.    Appearance: Normal appearance. She is well-developed. She is not ill-appearing or diaphoretic.  HENT:     Head: Normocephalic and atraumatic.  Eyes:     General:        Right eye: No discharge.        Left eye: No discharge.  Pulmonary:     Effort: Pulmonary effort is normal. No respiratory distress.  Musculoskeletal:     Comments: Unchanged laceration  to the left middle finger, no bleeding  Neurological:     Mental Status: She is alert and oriented to person, place, and time.     Coordination: Coordination normal.  Psychiatric:        Mood and Affect: Mood normal.        Behavior: Behavior normal.     ED Results / Procedures / Treatments   Labs (all labs ordered are listed, but only abnormal results are displayed) Labs Reviewed - No data to display  EKG None  Radiology No results found.  Procedures Procedures    Medications Ordered in ED Medications - No data to display  ED Course/ Medical Decision Making/ A&P                           Medical Decision Making  Patient presents for reevaluation for laceration to the left middle finger which I saw her for a  few hours before.  Patient was instructed to leave the dressing in place for 2 days but misunderstood this and after 2 hours took off the dressing and noted that the wound was still open and was concerned.  No bleeding, the finger is neurovascularly intact.  Replaced a dressing and finger splint and clarified instructions and provided patient with reassurance.  Discharged home in good condition.        Final Clinical Impression(s) / ED Diagnoses Final diagnoses:  Laceration of left middle finger without foreign body without damage to nail, subsequent encounter    Rx / DC Orders ED Discharge Orders     None         Janet Berlin 03/01/22 1426    Charlesetta Shanks, MD 03/01/22 1552

## 2022-03-01 NOTE — ED Provider Notes (Signed)
[facility] DEPT Provider Note   CSN: [id number] Arrival date & time: 03/01/22  0758     History  Chief Complaint  Patient presents with   Extremity Laceration    Rhonda Mann is a 46 y.o. female.  Rhonda Mann is a 46 y.o. female with history of hypertension, who presents to the ED for evaluation of laceration to her left middle finger.  She reports that yesterday morning she cut her finger while opening a can of dog food.  Laceration to the dorsum of the left finger over the PIP joint.  She reports that she has kept it wrapped up but when she tried to remove the dressing it is continued to bleed which is why she has come in today.  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  She is unsure when her last tetanus vaccination was.  Reports pain when trying to move the finger.  The history is provided by the patient.       Home Medications Prior to Admission medications   Medication Sig Start Date End Date Taking? Authorizing Provider  amLODipine (NORVASC) 2.5 MG tablet Take 1 tablet (2.5 mg total) by mouth daily. 05/04/21   Sponseller, Eugene Garnet R, PA-C  amoxicillin-clavulanate (AUGMENTIN) 875-125 MG tablet Take 1 tablet by mouth every 12 (twelve) hours. 01/10/22   Prosperi, Christian H, PA-C  benzonatate (TESSALON) 100 MG capsule Take 1 capsule (100 mg total) by mouth every 8 (eight) hours. 11/17/21   Domenic Moras, PA-C  fluticasone (FLONASE) 50 MCG/ACT nasal spray Place 1 spray into both nostrils daily. 11/17/21 12/17/21  Domenic Moras, PA-C  hydrochlorothiazide (HYDRODIURIL) 25 MG tablet Take 1 tablet (25 mg total) by mouth daily. Patient not taking: No sig reported 08/06/20   Megan Salon, MD  norethindrone (MICRONOR) 0.35 MG tablet Take 1 tablet (0.35 mg total) by mouth daily. Patient not taking: No sig reported 08/06/20   Megan Salon, MD  ondansetron  (ZOFRAN-ODT) 4 MG disintegrating tablet Take 1 tablet (4 mg total) by mouth every 8 (eight) hours as needed for nausea or vomiting. 08/06/21   Lorin Glass, PA-C  OVER THE COUNTER MEDICATION Apply 1 patch topically daily. Thrive    [provider]  oxyCODONE-acetaminophen (PERCOCET/ROXICET) 5-325 MG tablet Take 1 tablet by mouth every 6 (six) hours as needed for severe pain. 08/06/21   Lorin Glass, PA-C      Allergies    Patient has no known allergies.    Review of Systems   Review of Systems  Constitutional:  Negative for chills and fever.  Musculoskeletal:  Positive for arthralgias.  Skin:  Positive for wound.  Neurological:  Negative for weakness and numbness.    Physical Exam Updated Vital Signs BP (!) 167/117 (BP Location: Right Arm)   Pulse 83   Temp 98.1 F (36.7 C) (Oral)   Resp 16   Ht '5\' 8"'$  (1.727 m)   Wt 95.3 kg   SpO2 100%   BMI 31.93 kg/m  Physical Exam Vitals and nursing note reviewed.  Constitutional:      General: She is not in acute distress.    Appearance: Normal appearance. She is well-developed. She is not ill-appearing or diaphoretic.  HENT:     Head: Normocephalic and atraumatic.  Eyes:     General:        Right eye: No discharge.  Left eye: No discharge.  Pulmonary:     Effort: Pulmonary effort is normal. No respiratory distress.  Musculoskeletal:     Comments: There is a partially healing laceration over the dorsal surface of the DIP joint of the left middle finger.  Patient is able to fully flex and extend at the PIP joint but has difficulty fully flexing and extending at the DIP joint.  She has normal sensation.  Laceration is already prior to usually healed so difficult to determine if there is underlying tendon laceration visually.  No current bleeding.  Neurological:     Mental Status: She is alert and oriented to person, place, and time.     Coordination: Coordination normal.  Psychiatric:        Mood and Affect:  Mood normal.        Behavior: Behavior normal.     ED Results / Procedures / Treatments   Labs (all labs ordered are listed, but only abnormal results are displayed) Labs Reviewed - No data to display  EKG None  Radiology No results found.  Procedures Procedures    Medications Ordered in ED Medications  Tdap (BOOSTRIX) injection 0.5 mL (0.5 mLs Intramuscular Given 03/01/22 0839)  ibuprofen (ADVIL) tablet 600 mg (600 mg Oral Given 03/01/22 9449)    ED Course/ Medical Decision Making/ A&P                           Medical Decision Making  46 year old female presents with laceration to the left middle finger.  This occurred about 24 hours ago.  Laceration over the dorsal PIP joint.  The laceration is already partially healing so it is difficult to fully examine through the base of the wound to visualize any potential tendon injury but patient does have some limited flexion and extension of the DIP joint concerning for potential tendon injury.  Given that wound has been open for 24 hours feel risk of infection is too high for sutured wound closure at this time.  We will allow wound to heal on its own.  Wound was cleaned and dressing and finger splint placed.  Tetanus updated.  If after healing patient continues to have difficulty with flexion extension of the DIP joint will need hand surgery follow-up.  Given information regarding this.  Discussed appropriate wound care and return precautions.  Discharged home in good condition.        Final Clinical Impression(s) / ED Diagnoses Final diagnoses:  Laceration of left middle finger without foreign body without damage to nail, initial encounter    Rx / DC Orders ED Discharge Orders     None         Janet Berlin 03/01/22 6759    Charlesetta Shanks, MD 03/01/22 [phone]

## 2022-04-21 ENCOUNTER — Encounter: Payer: Self-pay | Admitting: *Deleted

## 2022-05-19 ENCOUNTER — Telehealth: Payer: Medicaid Other | Admitting: Family Medicine

## 2022-05-19 DIAGNOSIS — M546 Pain in thoracic spine: Secondary | ICD-10-CM

## 2022-05-19 NOTE — Progress Notes (Signed)
Based on what you shared with me, I feel your condition warrants further evaluation as soon as possible at an Emergency department.  Changes in sensation, pain going into both legs, the worse pain you have ever felt, and weakness- pain with breathing and coughing-you need to be seen in the ED for evaluation of what Is going on.   NOTE: There will be NO CHARGE for this eVisit   If you are having a true medical emergency please call 911.

## 2022-09-12 IMAGING — CR DG CHEST 2V
2 series · 2 of 2 positions shown · non-contrast
Comparison: Chest CT 02/10/2020. Chest radiographs 05/04/2021 and
earlier.

CLINICAL DATA: 45-year-old female with cough, sore throat,
congestion, shortness of breath for 2 days. Smoker.

EXAM:
CHEST - 2 VIEW

[w chest pa]
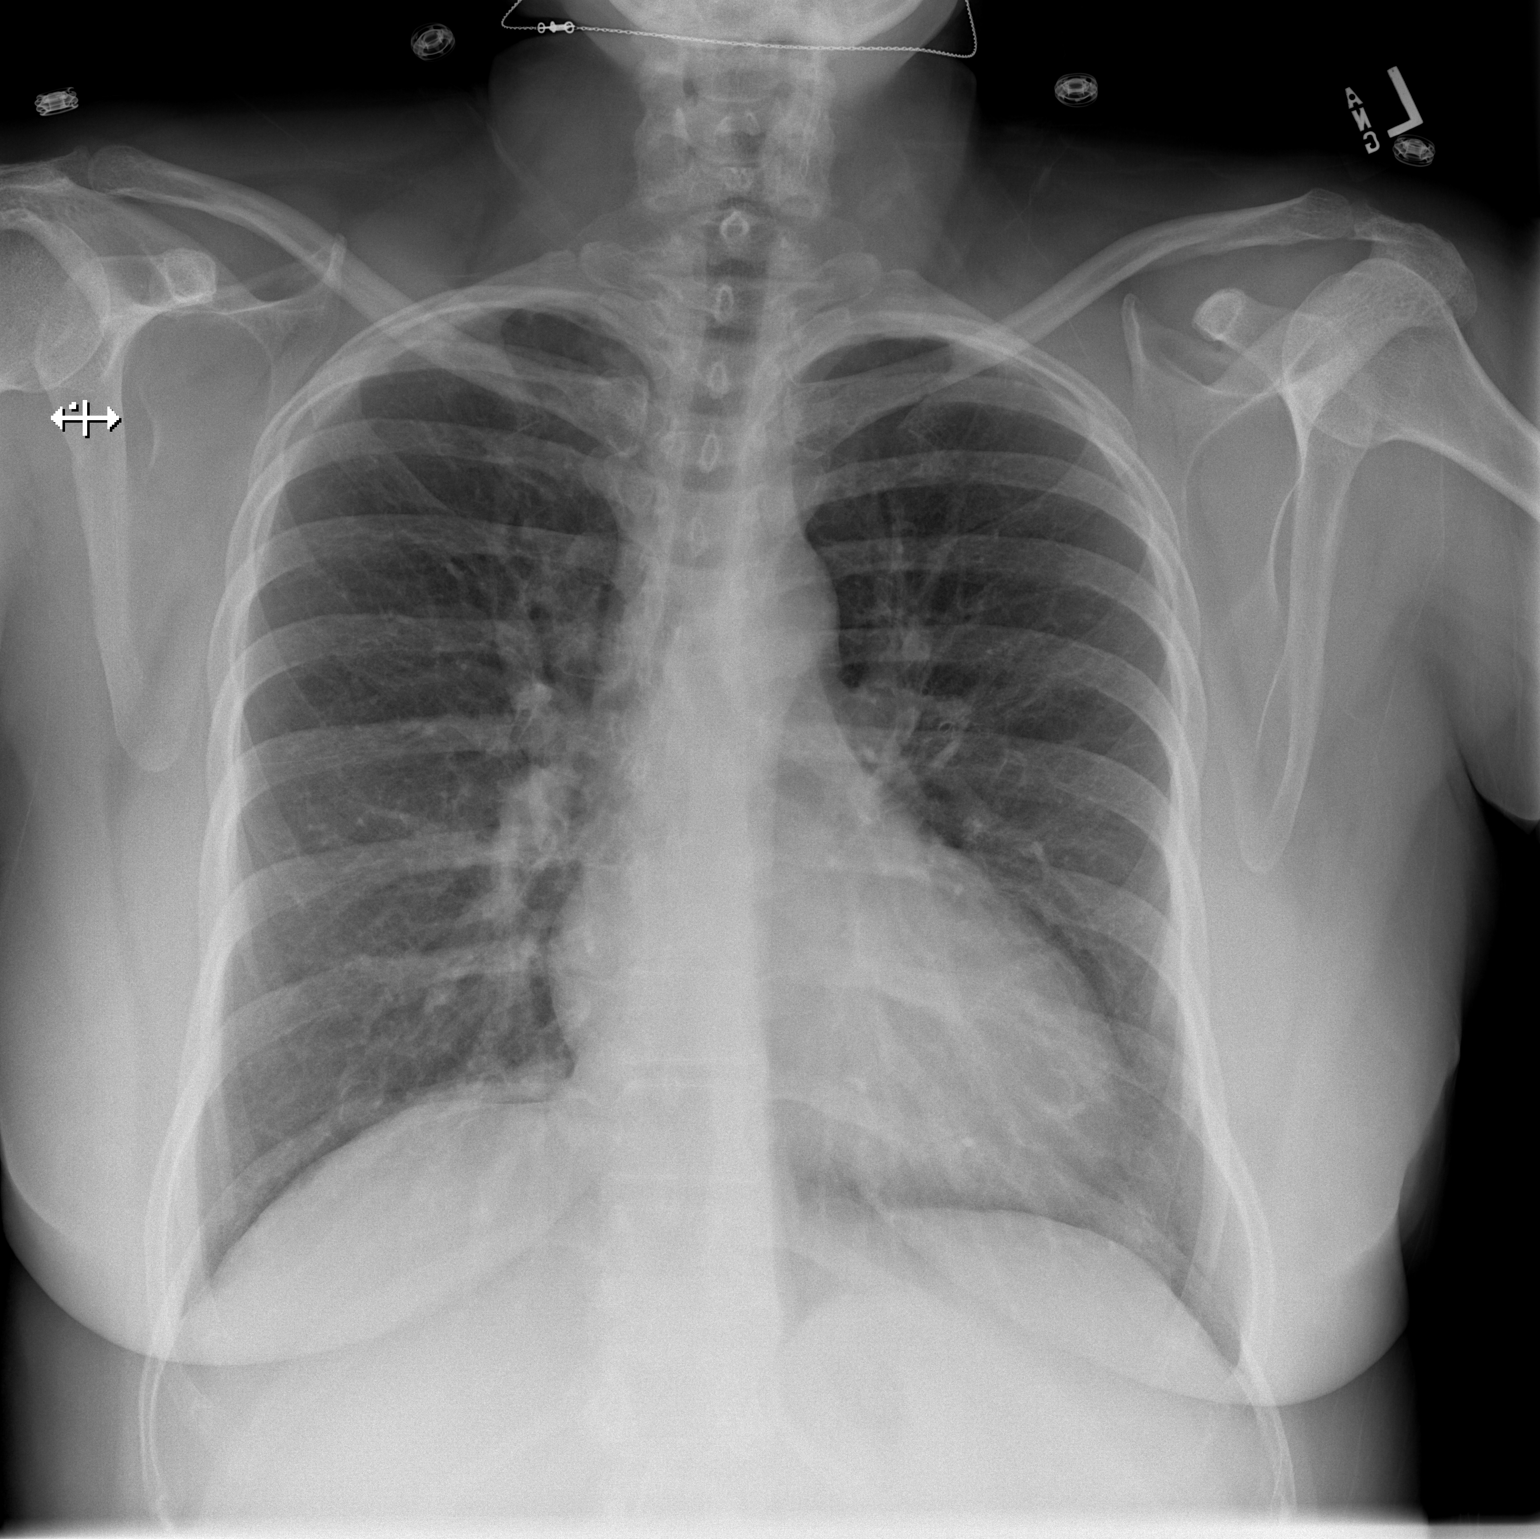

[w chest lat]
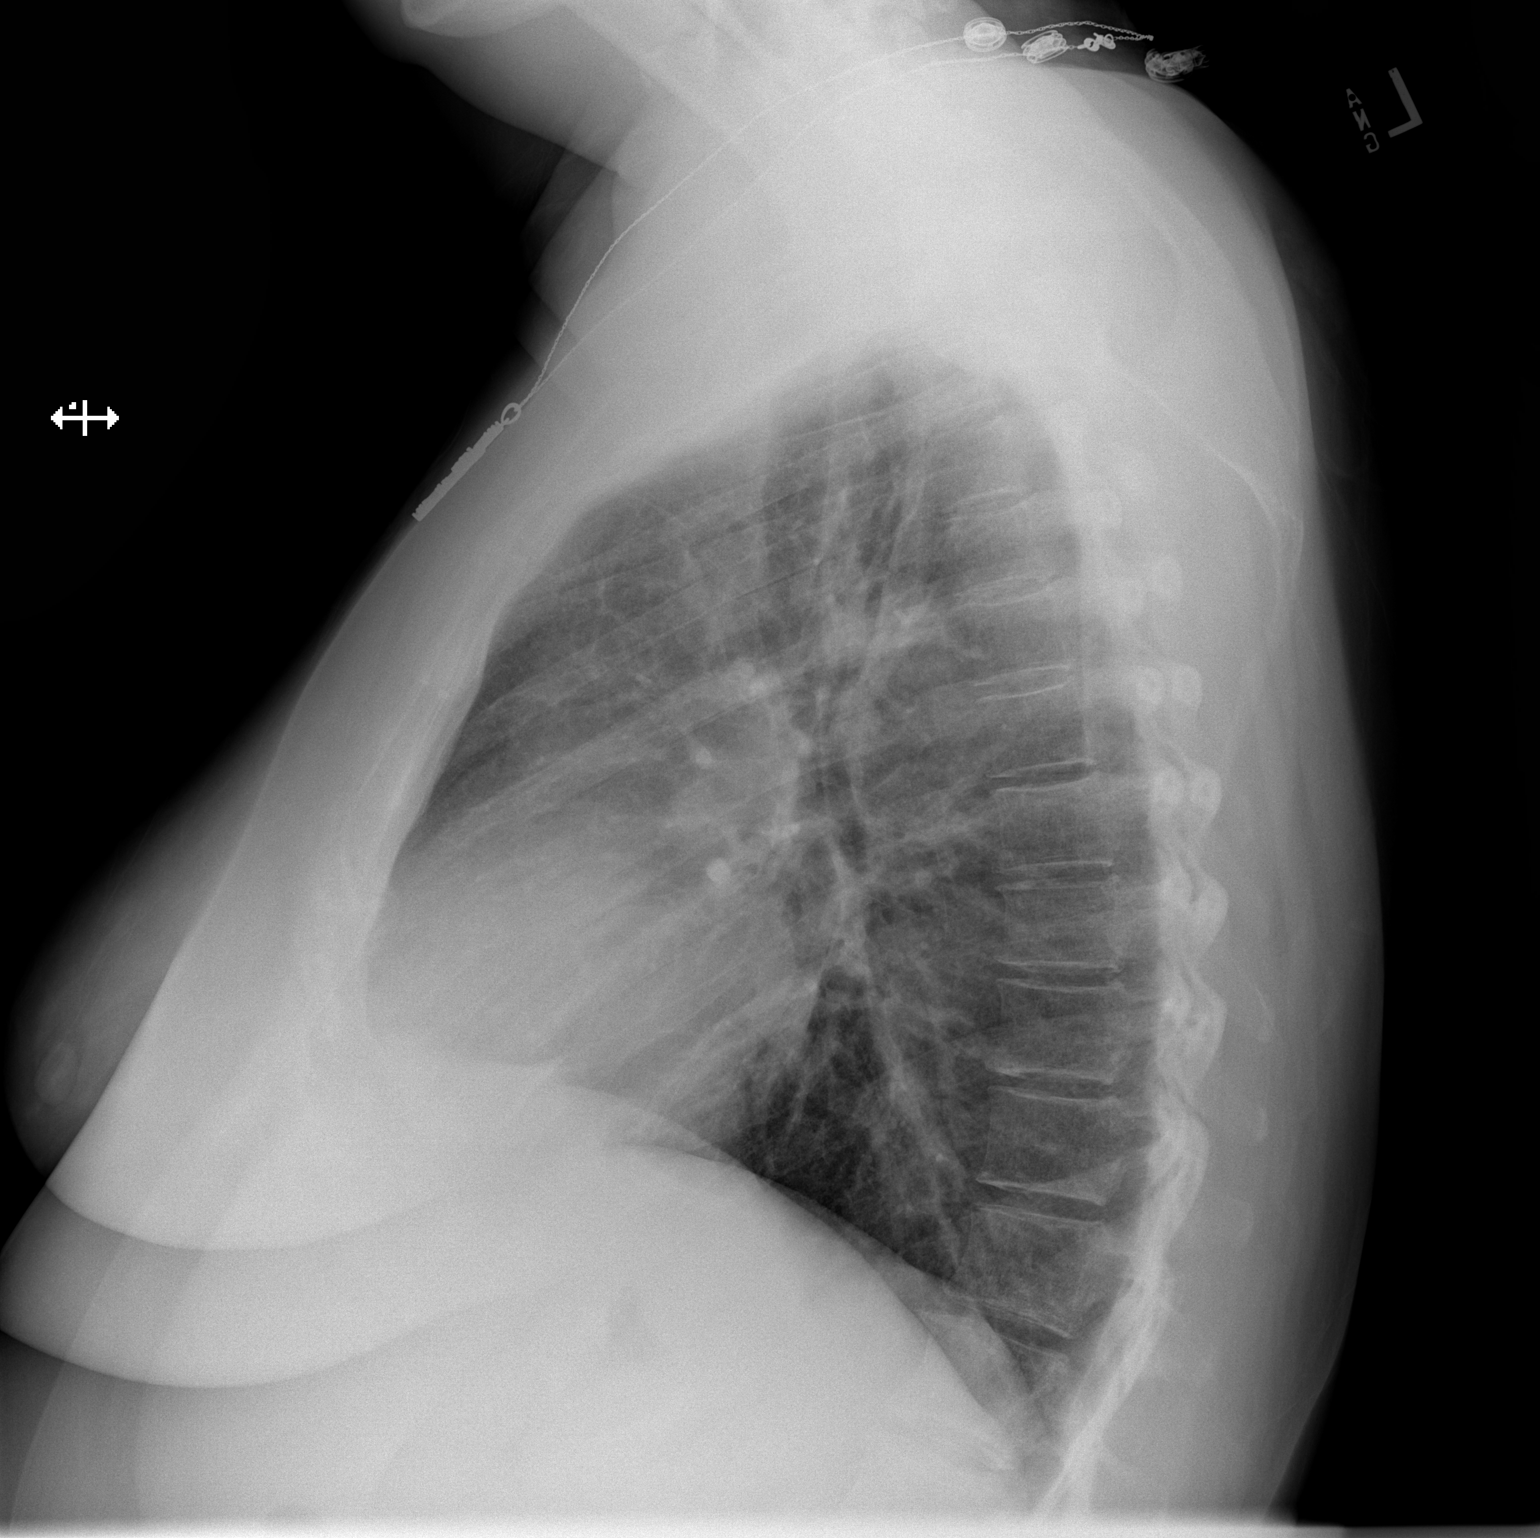

[2 of 2 positions shown; findings below may reference images not displayed]

FINDINGS: Lung volumes and mediastinal contours are stable and within normal
limits. Visualized tracheal air column is within normal limits.

Mild chronic increased interstitial markings in both lungs. No
pneumothorax, pulmonary edema, pleural effusion or acute pulmonary
opacity. No osseous abnormality identified. Negative visible bowel
gas.
IMPRESSION: No acute cardiopulmonary abnormality. Stable chronic and likely
smoking related increased interstitial lung markings.

## 2022-11-20 ENCOUNTER — Ambulatory Visit: Payer: Medicaid Other | Admitting: Podiatry

## 2023-01-08 ENCOUNTER — Ambulatory Visit (INDEPENDENT_AMBULATORY_CARE_PROVIDER_SITE_OTHER): Payer: Medicaid Other

## 2023-01-08 ENCOUNTER — Other Ambulatory Visit: Payer: Self-pay | Admitting: Podiatry

## 2023-01-08 ENCOUNTER — Encounter: Payer: Self-pay | Admitting: Podiatry

## 2023-01-08 ENCOUNTER — Ambulatory Visit (INDEPENDENT_AMBULATORY_CARE_PROVIDER_SITE_OTHER): Payer: Medicaid Other | Admitting: Podiatry

## 2023-01-08 DIAGNOSIS — M722 Plantar fascial fibromatosis: Secondary | ICD-10-CM

## 2023-01-08 MED ORDER — DICLOFENAC SODIUM 75 MG PO TBEC: 75.0000 mg | DELAYED_RELEASE_TABLET | Freq: Two times a day (BID) | ORAL | 2 refills | Status: DC

## 2023-01-08 MED ORDER — TRIAMCINOLONE ACETONIDE 10 MG/ML IJ SUSP
10.0000 mg | Freq: Once | INTRAMUSCULAR | Status: AC
  Administered 2023-01-08: 10 mg

## 2023-01-08 NOTE — Progress Notes (Signed)
Subjective:   Patient ID: Rhonda Mann, female   DOB: 47 y.o.   MRN: [id number]   HPI Patient presents with a lot of pain in the bottom of the left heel and states it hurts all the time and has gotten worse over the last couple months.  Patient smokes half pack of cigarettes per day tries to be active   Review of Systems  All other systems reviewed and are negative.       Objective:  Physical Exam Vitals and nursing note reviewed.  Constitutional:      Appearance: She is well-developed.  Pulmonary:     Effort: Pulmonary effort is normal.  Musculoskeletal:        General: Normal range of motion.  Skin:    General: Skin is warm.  Neurological:     Mental Status: She is alert.     Neurovascular status found to be intact muscle strength was found to be adequate range of motion adequate exquisite discomfort plantar fascia left at the insertion of the tendon into the calcaneus with fluid buildup.  Patient is noted to have good digital perfusion well oriented moderate depression of the arch     Assessment:  Acute Planter fasciitis left with inflammation fluid buildup     Plan:  H&P reviewed and I went ahead today and after reviewing x-rays did sterile prep injected the plantar fascia at insertion 3 mg Kenalog 5 mg Xylocaine instructed on supportive shoes and physical therapy and reappoint as symptoms indicate  X-rays indicate spur formation plantar heel no indication stress fracture arthritis

## 2023-01-08 NOTE — Patient Instructions (Signed)

## 2023-01-22 DIAGNOSIS — Z1231 Encounter for screening mammogram for malignant neoplasm of breast: Secondary | ICD-10-CM

## 2023-01-28 ENCOUNTER — Ambulatory Visit: Payer: Medicaid Other | Admitting: Podiatry

## 2023-02-05 ENCOUNTER — Ambulatory Visit: Payer: Medicaid Other | Admitting: Podiatry

## 2023-02-10 ENCOUNTER — Institutional Professional Consult (permissible substitution): Payer: Medicaid Other | Admitting: Plastic Surgery

## 2023-02-16 ENCOUNTER — Ambulatory Visit: Payer: Medicaid Other | Admitting: Podiatry

## 2023-03-26 ENCOUNTER — Other Ambulatory Visit: Payer: Self-pay | Admitting: Surgery

## 2023-03-26 DIAGNOSIS — K432 Incisional hernia without obstruction or gangrene: Secondary | ICD-10-CM

## 2023-04-01 ENCOUNTER — Inpatient Hospital Stay
Admission: RE | Admit: 2023-04-01 | Discharge: 2023-04-01 | Disposition: A | Discharge status: Home / Self Care | Payer: Medicaid Other | Source: Ambulatory Visit | Attending: Surgery

## 2023-04-01 DIAGNOSIS — K432 Incisional hernia without obstruction or gangrene: Secondary | ICD-10-CM

## 2023-04-01 MED ORDER — IOPAMIDOL (ISOVUE-300) INJECTION 61%
100.0000 mL | Freq: Once | INTRAVENOUS | Status: AC | PRN
  Administered 2023-04-01: 100 mL via INTRAVENOUS

## 2023-06-16 NOTE — Progress Notes (Signed)
DR. Marvetta Gibbons office was called for preop instructions. Spoke to [place], Charity fundraiser.

## 2023-06-16 NOTE — Progress Notes (Signed)
Surgical Instructions    Your procedure is scheduled on Friday, December 20th.   Report to [facility] Main Entrance "A" at 0930 A.M., then check in with the Admitting office.  Call this number if you have problems the morning of surgery:  [phone]  If you have any questions prior to your surgery date call [phone]: Open Monday-Friday 8am-4pm If you experience any cold or flu symptoms such as cough, fever, chills, shortness of breath, etc. between now and your scheduled surgery, please notify us at the above number.     Remember:  Do not eat after midnight the night before your surgery  You may drink clear liquids until 0830 the morning of your surgery.   Clear liquids allowed are: Water, Non-Citrus Juices (without pulp), Carbonated Beverages, Clear Tea, Black Coffee Only (NO MILK, CREAM OR POWDERED CREAMER of any kind), and Gatorade.    Take these medicines the morning of surgery with A SIP OF WATER : none  As of today, STOP taking any Aspirin (unless otherwise instructed by your surgeon) Aleve, Naproxen, Ibuprofen, Motrin, Advil, Goody's, BC's, all herbal medications, fish oil, and all vitamins.                     Do NOT Smoke (Tobacco/Vaping) for 24 hours prior to your procedure.  If you use a CPAP at night, you may bring your mask/headgear for your overnight stay.   Contacts, glasses, piercing's, hearing aid's, dentures or partials may not be worn into surgery, please bring cases for these belongings.    For patients admitted to the hospital, discharge time will be determined by your treatment team.   Patients discharged the day of surgery will not be allowed to drive home, and someone needs to stay with them for 24 hours.  SURGICAL WAITING ROOM VISITATION Patients having surgery or a procedure may have no more than 2 support people in the waiting area - these visitors may rotate.   Children under the age of 52 must have an adult with them who is not the patient. If  the patient needs to stay at the hospital during part of their recovery, the visitor guidelines for inpatient rooms apply. Pre-op nurse will coordinate an appropriate time for 1 support person to accompany patient in pre-op.  This support person may not rotate.   Please refer to the [facility] website for the visitor guidelines for Inpatients (after your surgery is over and you are in a regular room).    Special instructions:   [facility]- Preparing For Surgery  Before surgery, you can play an important role. Because skin is not sterile, your skin needs to be as free of germs as possible. You can reduce the number of germs on your skin by washing with CHG (chlorahexidine gluconate) Soap before surgery.  CHG is an antiseptic cleaner which kills germs and bonds with the skin to continue killing germs even after washing.    Oral Hygiene is also important to reduce your risk of infection.  Remember - BRUSH YOUR TEETH THE MORNING OF SURGERY WITH YOUR REGULAR TOOTHPASTE  Please do not use if you have an allergy to CHG or antibacterial soaps. If your skin becomes reddened/irritated stop using the CHG.  Do not shave (including legs and underarms) for at least 48 hours prior to first CHG shower. It is OK to shave your face.  Please follow these instructions carefully.   Shower the NIGHT BEFORE SURGERY and the MORNING OF SURGERY  If you chose to wash your hair, wash your hair first as usual with your normal shampoo.  After you shampoo, rinse your hair and body thoroughly to remove the shampoo.  Use CHG Soap as you would any other liquid soap. You can apply CHG directly to the skin and wash gently with a scrungie or a clean washcloth.   Apply the CHG Soap to your body ONLY FROM THE NECK DOWN.  Do not use on open wounds or open sores. Avoid contact with your eyes, ears, mouth and genitals (private parts). Wash Face and genitals (private parts)  with your normal soap.   Wash thoroughly, paying  special attention to the area where your surgery will be performed.  Thoroughly rinse your body with warm water from the neck down.  DO NOT shower/wash with your normal soap after using and rinsing off the CHG Soap.  Pat yourself dry with a CLEAN TOWEL.  Wear CLEAN PAJAMAS to bed the night before surgery  Place CLEAN SHEETS on your bed the night before your surgery  DO NOT SLEEP WITH PETS.   Day of Surgery: Take a shower with CHG soap. Do not wear jewelry or makeup Do not wear lotions, powders, perfumes/colognes, or deodorant. Do not shave 48 hours prior to surgery.  Men may shave face and neck. Do not bring valuables to the hospital.  [facility] is not responsible for any belongings or valuables. Do not wear nail polish, gel polish, artificial nails, or any other type of covering on natural nails (fingers and toes) If you have artificial nails or gel coating that need to be removed by a nail salon, please have this removed prior to surgery. Artificial nails or gel coating may interfere with anesthesia's ability to adequately monitor your vital signs. Wear Clean/Comfortable clothing the morning of surgery Remember to brush your teeth WITH YOUR REGULAR TOOTHPASTE.   Please read over the following fact sheets that you were given.    If you received a COVID test during your pre-op visit  it is requested that you wear a mask when out in public, stay away from anyone that may not be feeling well and notify your surgeon if you develop symptoms. If you have been in contact with anyone that has tested positive in the last 10 days please notify you surgeon.

## 2023-06-17 ENCOUNTER — Other Ambulatory Visit: Payer: Self-pay

## 2023-06-17 ENCOUNTER — Ambulatory Visit: Payer: Self-pay | Admitting: Surgery

## 2023-06-17 ENCOUNTER — Encounter (HOSPITAL_COMMUNITY): Payer: Self-pay

## 2023-06-17 ENCOUNTER — Encounter (HOSPITAL_COMMUNITY)
Admission: RE | Admit: 2023-06-17 | Discharge: 2023-06-17 | Disposition: A | Discharge status: Home / Self Care | Payer: Medicaid Other | Source: Ambulatory Visit | Attending: Surgery | Admitting: Surgery

## 2023-06-17 VITALS — BP 149/88 | HR 79 | Temp 98.3°F | Resp 17 | Ht 69.0 in | Wt 187.0 lb

## 2023-06-17 DIAGNOSIS — Z01818 Encounter for other preprocedural examination: Secondary | ICD-10-CM | POA: Diagnosis present

## 2023-06-17 HISTORY — DX: Anemia, unspecified: D64.9

## 2023-06-17 LAB — CBC
HCT: 43.7 % (ref 36.0–46.0)
Hemoglobin: 14.7 g/dL (ref 12.0–15.0)
MCH: 31.1 pg (ref 26.0–34.0)
MCHC: 33.6 g/dL (ref 30.0–36.0)
MCV: 92.4 fL (ref 80.0–100.0)
Platelets: 256 10*3/uL (ref 150–400)
RBC: 4.73 MIL/uL (ref 3.87–5.11)
RDW: 14.4 % (ref 11.5–15.5)
WBC: 8.3 10*3/uL (ref 4.0–10.5)
nRBC: 0 % (ref 0.0–0.2)

## 2023-06-17 LAB — BASIC METABOLIC PANEL
Anion gap: 8 (ref 5–15)
BUN: 9 mg/dL (ref 6–20)
CO2: 26 mmol/L (ref 22–32)
Calcium: 9 mg/dL (ref 8.9–10.3)
Chloride: 107 mmol/L (ref 98–111)
Creatinine, Ser: 0.66 mg/dL (ref 0.44–1.00)
GFR, Estimated: >60 mL/min (ref >60)
Glucose, Bld: 131 mg/dL — ABNORMAL HIGH (ref 70–99)
Potassium: 3.6 mmol/L (ref 3.5–5.1)
Sodium: 141 mmol/L (ref 135–145)

## 2023-06-17 NOTE — Progress Notes (Addendum)
PCP - [facility] [place] Cardiologist - Denies  PPM/ICD - Denies Device Orders - n/a Rep Notified - n/a  Chest x-ray - n/a EKG - n/a Stress Test - n/a  ECHO - n/a Cardiac Cath - n/a   Sleep Study - denies CPAP - n/a  Fasting Blood Sugar - denies Checks Blood Sugar _____ times a day. N/a  Last dose of GLP1 agonist-  denies GLP1 instructions: n/a  Blood Thinner Instructions: denies Aspirin Instructions: denies  ERAS Protcol - eras PRE-SURGERY Ensure or G2- no drink  COVID TEST- n   Anesthesia review: n  Patient denies shortness of breath, fever, cough and chest pain at PAT appointment. Patient denies any respiratory illnesses at this time.   All instructions explained to the patient, with a verbal understanding of the material. Patient agrees to go over the instructions while at home for a better understanding. Patient also instructed to self quarantine after being tested for COVID-19. The opportunity to ask questions was provided.

## 2023-06-19 ENCOUNTER — Encounter (HOSPITAL_COMMUNITY): Payer: Self-pay | Admitting: Surgery

## 2023-06-19 ENCOUNTER — Ambulatory Visit (HOSPITAL_COMMUNITY)
Admission: RE | Admit: 2023-06-19 | Discharge: 2023-06-19 | Disposition: A | Discharge status: Home / Self Care | Payer: Medicaid Other | Attending: Surgery | Admitting: Surgery

## 2023-06-19 ENCOUNTER — Other Ambulatory Visit: Payer: Self-pay

## 2023-06-19 ENCOUNTER — Encounter (HOSPITAL_COMMUNITY)
Admission: RE | Disposition: A | Discharge status: Home / Self Care | Payer: Self-pay | Source: Home / Self Care | Attending: Surgery

## 2023-06-19 ENCOUNTER — Ambulatory Visit (HOSPITAL_BASED_OUTPATIENT_CLINIC_OR_DEPARTMENT_OTHER): Payer: Medicaid Other | Admitting: Anesthesiology

## 2023-06-19 ENCOUNTER — Ambulatory Visit (HOSPITAL_COMMUNITY): Payer: Medicaid Other | Admitting: Anesthesiology

## 2023-06-19 DIAGNOSIS — K219 Gastro-esophageal reflux disease without esophagitis: Secondary | ICD-10-CM | POA: Insufficient documentation

## 2023-06-19 DIAGNOSIS — I10 Essential (primary) hypertension: Secondary | ICD-10-CM | POA: Insufficient documentation

## 2023-06-19 DIAGNOSIS — F17210 Nicotine dependence, cigarettes, uncomplicated: Secondary | ICD-10-CM | POA: Insufficient documentation

## 2023-06-19 DIAGNOSIS — K429 Umbilical hernia without obstruction or gangrene: Secondary | ICD-10-CM | POA: Diagnosis present

## 2023-06-19 DIAGNOSIS — K430 Incisional hernia with obstruction, without gangrene: Secondary | ICD-10-CM | POA: Insufficient documentation

## 2023-06-19 HISTORY — PX: INCISIONAL HERNIA REPAIR: SHX193

## 2023-06-19 HISTORY — PX: LAPAROSCOPY: SHX197

## 2023-06-19 LAB — POCT PREGNANCY, URINE: Preg Test, Ur: NEGATIVE

## 2023-06-19 SURGERY — REPAIR, HERNIA, INCISIONAL
Anesthesia: General

## 2023-06-19 MED ORDER — BUPIVACAINE LIPOSOME 1.3 % IJ SUSP
INTRAMUSCULAR | Status: AC
  Filled 2023-06-19: qty 20

## 2023-06-19 MED ORDER — FENTANYL CITRATE (PF) 250 MCG/5ML IJ SOLN
INTRAMUSCULAR | Status: AC
  Filled 2023-06-19: qty 5

## 2023-06-19 MED ORDER — ROCURONIUM BROMIDE 10 MG/ML (PF) SYRINGE
PREFILLED_SYRINGE | INTRAVENOUS | Status: DC | PRN
  Administered 2023-06-19: 50 mg via INTRAVENOUS

## 2023-06-19 MED ORDER — MIDAZOLAM HCL 2 MG/2ML IJ SOLN
INTRAMUSCULAR | Status: AC
  Filled 2023-06-19: qty 2

## 2023-06-19 MED ORDER — SUCCINYLCHOLINE CHLORIDE 200 MG/10ML IV SOSY
PREFILLED_SYRINGE | INTRAVENOUS | Status: AC
Start: 2023-06-19 — End: ?
  Filled 2023-06-19: qty 10

## 2023-06-19 MED ORDER — PROPOFOL 10 MG/ML IV BOLUS
INTRAVENOUS | Status: DC | PRN
  Administered 2023-06-19: 200 mg via INTRAVENOUS

## 2023-06-19 MED ORDER — MIDAZOLAM HCL 2 MG/2ML IJ SOLN
0.5000 mg | Freq: Once | INTRAMUSCULAR | Status: DC | PRN
Start: 2023-06-19 — End: 2023-06-19

## 2023-06-19 MED ORDER — METHOCARBAMOL 500 MG PO TABS: 500.0000 mg | ORAL_TABLET | Freq: Once | ORAL | Status: DC

## 2023-06-19 MED ORDER — LIDOCAINE 2% (20 MG/ML) 5 ML SYRINGE
INTRAMUSCULAR | Status: AC
  Filled 2023-06-19: qty 5

## 2023-06-19 MED ORDER — DEXAMETHASONE SODIUM PHOSPHATE 10 MG/ML IJ SOLN
INTRAMUSCULAR | Status: AC
  Filled 2023-06-19: qty 1

## 2023-06-19 MED ORDER — METHOCARBAMOL 500 MG PO TABS
ORAL_TABLET | ORAL | Status: AC
  Filled 2023-06-19: qty 1

## 2023-06-19 MED ORDER — SUGAMMADEX SODIUM 200 MG/2ML IV SOLN
INTRAVENOUS | Status: DC | PRN
  Administered 2023-06-19: 170 mg via INTRAVENOUS

## 2023-06-19 MED ORDER — ROCURONIUM BROMIDE 10 MG/ML (PF) SYRINGE
PREFILLED_SYRINGE | INTRAVENOUS | Status: AC
  Filled 2023-06-19: qty 10

## 2023-06-19 MED ORDER — DEXAMETHASONE SODIUM PHOSPHATE 10 MG/ML IJ SOLN
INTRAMUSCULAR | Status: DC | PRN
  Administered 2023-06-19: 10 mg via INTRAVENOUS

## 2023-06-19 MED ORDER — OXYCODONE HCL 5 MG PO TABS: 5.0000 mg | ORAL_TABLET | ORAL | 0 refills | Status: DC | PRN

## 2023-06-19 MED ORDER — KETOROLAC TROMETHAMINE 30 MG/ML IJ SOLN
INTRAMUSCULAR | Status: DC | PRN
  Administered 2023-06-19: 30 mg via INTRAVENOUS

## 2023-06-19 MED ORDER — BUPIVACAINE HCL (PF) 0.25 % IJ SOLN
INTRAMUSCULAR | Status: AC
  Filled 2023-06-19: qty 30

## 2023-06-19 MED ORDER — AMISULPRIDE (ANTIEMETIC) 5 MG/2ML IV SOLN
INTRAVENOUS | Status: AC
  Administered 2023-06-19: 10 mg
  Filled 2023-06-19: qty 4

## 2023-06-19 MED ORDER — HYDROMORPHONE HCL 1 MG/ML IJ SOLN
INTRAMUSCULAR | Status: AC
  Administered 2023-06-19: 0.5 mg via INTRAVENOUS
  Filled 2023-06-19: qty 1

## 2023-06-19 MED ORDER — KETOROLAC TROMETHAMINE 30 MG/ML IJ SOLN
INTRAMUSCULAR | Status: AC
  Filled 2023-06-19: qty 1

## 2023-06-19 MED ORDER — OXYCODONE HCL 5 MG/5ML PO SOLN
5.0000 mg | Freq: Once | ORAL | Status: AC | PRN
Start: 2023-06-19 — End: 2023-06-19

## 2023-06-19 MED ORDER — MIDAZOLAM HCL 2 MG/2ML IJ SOLN
INTRAMUSCULAR | Status: DC | PRN
  Administered 2023-06-19: 2 mg via INTRAVENOUS

## 2023-06-19 MED ORDER — ONDANSETRON HCL 4 MG/2ML IJ SOLN
INTRAMUSCULAR | Status: DC | PRN
  Administered 2023-06-19: 4 mg via INTRAVENOUS

## 2023-06-19 MED ORDER — ACETAMINOPHEN 500 MG PO TABS: 1000.0000 mg | ORAL_TABLET | Freq: Four times a day (QID) | ORAL | 3 refills | Status: AC

## 2023-06-19 MED ORDER — HYDROMORPHONE HCL 1 MG/ML IJ SOLN
0.2500 mg | INTRAMUSCULAR | Status: DC | PRN
  Administered 2023-06-19 (×2): 0.5 mg via INTRAVENOUS

## 2023-06-19 MED ORDER — ONDANSETRON HCL 4 MG/2ML IJ SOLN
INTRAMUSCULAR | Status: AC
  Filled 2023-06-19: qty 2

## 2023-06-19 MED ORDER — ACETAMINOPHEN 500 MG PO TABS
1000.0000 mg | ORAL_TABLET | Freq: Once | ORAL | Status: AC
  Administered 2023-06-19: 1000 mg via ORAL
  Filled 2023-06-19: qty 2

## 2023-06-19 MED ORDER — SUCCINYLCHOLINE CHLORIDE 200 MG/10ML IV SOSY
PREFILLED_SYRINGE | INTRAVENOUS | Status: DC | PRN
  Administered 2023-06-19: 140 mg via INTRAVENOUS

## 2023-06-19 MED ORDER — CHLORHEXIDINE GLUCONATE 0.12 % MT SOLN
15.0000 mL | Freq: Once | OROMUCOSAL | Status: AC
  Administered 2023-06-19: 15 mL via OROMUCOSAL
  Filled 2023-06-19: qty 15

## 2023-06-19 MED ORDER — BUPIVACAINE LIPOSOME 1.3 % IJ SUSP
INTRAMUSCULAR | Status: DC | PRN
  Administered 2023-06-19: 20 mL via SURGICAL_CAVITY

## 2023-06-19 MED ORDER — CHLORHEXIDINE GLUCONATE CLOTH 2 % EX PADS: 6.0000 | MEDICATED_PAD | Freq: Once | CUTANEOUS | Status: DC

## 2023-06-19 MED ORDER — HYDROMORPHONE HCL 1 MG/ML IJ SOLN
INTRAMUSCULAR | Status: DC | PRN
  Administered 2023-06-19: .5 mg via INTRAVENOUS

## 2023-06-19 MED ORDER — PROPOFOL 10 MG/ML IV BOLUS
INTRAVENOUS | Status: AC
  Filled 2023-06-19: qty 20

## 2023-06-19 MED ORDER — BUPIVACAINE LIPOSOME 1.3 % IJ SUSP: 20.0000 mL | Freq: Once | INTRAMUSCULAR | Status: DC

## 2023-06-19 MED ORDER — IBUPROFEN 600 MG PO TABS: 600.0000 mg | ORAL_TABLET | Freq: Four times a day (QID) | ORAL | 1 refills | Status: DC

## 2023-06-19 MED ORDER — LACTATED RINGERS IV SOLN: INTRAVENOUS | Status: DC

## 2023-06-19 MED ORDER — MEPERIDINE HCL 25 MG/ML IJ SOLN: 6.2500 mg | INTRAMUSCULAR | Status: DC | PRN

## 2023-06-19 MED ORDER — HYDROMORPHONE HCL 1 MG/ML IJ SOLN
INTRAMUSCULAR | Status: AC
  Filled 2023-06-19: qty 0.5

## 2023-06-19 MED ORDER — FENTANYL CITRATE (PF) 250 MCG/5ML IJ SOLN
INTRAMUSCULAR | Status: DC | PRN
  Administered 2023-06-19: 100 ug via INTRAVENOUS
  Administered 2023-06-19 (×3): 50 ug via INTRAVENOUS

## 2023-06-19 MED ORDER — ORAL CARE MOUTH RINSE
15.0000 mL | Freq: Once | OROMUCOSAL | Status: AC
Start: 2023-06-19 — End: 2023-06-19

## 2023-06-19 MED ORDER — CEFAZOLIN SODIUM-DEXTROSE 2-4 GM/100ML-% IV SOLN
2.0000 g | INTRAVENOUS | Status: AC
  Administered 2023-06-19: 2 g via INTRAVENOUS
  Filled 2023-06-19: qty 100

## 2023-06-19 MED ORDER — 0.9 % SODIUM CHLORIDE (POUR BTL) OPTIME
TOPICAL | Status: DC | PRN
  Administered 2023-06-19: 1000 mL

## 2023-06-19 MED ORDER — OXYCODONE HCL 5 MG PO TABS
ORAL_TABLET | ORAL | Status: AC
  Administered 2023-06-19: 5 mg via ORAL
  Filled 2023-06-19: qty 1

## 2023-06-19 MED ORDER — LIDOCAINE 2% (20 MG/ML) 5 ML SYRINGE
INTRAMUSCULAR | Status: DC | PRN
  Administered 2023-06-19: 40 mg via INTRAVENOUS

## 2023-06-19 MED ORDER — METHOCARBAMOL 750 MG PO TABS: 750.0000 mg | ORAL_TABLET | Freq: Four times a day (QID) | ORAL | 1 refills | Status: DC

## 2023-06-19 MED ORDER — DOCUSATE SODIUM 100 MG PO CAPS: 100.0000 mg | ORAL_CAPSULE | Freq: Two times a day (BID) | ORAL | 2 refills | Status: AC

## 2023-06-19 MED ORDER — ACETAMINOPHEN 500 MG PO TABS: 1000.0000 mg | ORAL_TABLET | ORAL | Status: DC

## 2023-06-19 MED ORDER — SODIUM CHLORIDE 0.9 % IV SOLN: INTRAVENOUS | Status: DC | PRN

## 2023-06-19 MED ORDER — OXYCODONE HCL 5 MG PO TABS
5.0000 mg | ORAL_TABLET | Freq: Once | ORAL | Status: AC | PRN
Start: 2023-06-19 — End: 2023-06-19

## 2023-06-19 SURGICAL SUPPLY — 45 items
BAG COUNTER SPONGE SURGICOUNT (BAG) ×2 IMPLANT
BINDER ABDOMINAL 12 ML 46-62 (SOFTGOODS) IMPLANT
CANISTER SUCT 3000ML PPV (MISCELLANEOUS) IMPLANT
CHLORAPREP W/TINT 26 (MISCELLANEOUS) ×2 IMPLANT
COVER SURGICAL LIGHT HANDLE (MISCELLANEOUS) ×2 IMPLANT
DERMABOND ADVANCED .7 DNX12 (GAUZE/BANDAGES/DRESSINGS) ×2 IMPLANT
DEVICE SECURE STRAP 25 ABSORB (INSTRUMENTS) ×2 IMPLANT
DEVICE TROCAR PUNCTURE CLOSURE (ENDOMECHANICALS) ×2 IMPLANT
DRAPE INCISE IOBAN 66X45 STRL (DRAPES) ×2 IMPLANT
ELECT CAUTERY BLADE 6.4 (BLADE) IMPLANT
ELECT REM PT RETURN 9FT ADLT (ELECTROSURGICAL) ×2
ELECTRODE REM PT RTRN 9FT ADLT (ELECTROSURGICAL) ×2 IMPLANT
GLOVE BIO SURGEON STRL SZ 6.5 (GLOVE) ×2 IMPLANT
GLOVE BIOGEL PI IND STRL 6 (GLOVE) ×2 IMPLANT
GOWN STRL REUS W/ TWL LRG LVL3 (GOWN DISPOSABLE) ×6 IMPLANT
GRASPER SUT TROCAR 14GX15 (MISCELLANEOUS) IMPLANT
IRRIG SUCT STRYKERFLOW 2 WTIP (MISCELLANEOUS)
IRRIGATION SUCT STRKRFLW 2 WTP (MISCELLANEOUS) IMPLANT
KIT BASIN OR (CUSTOM PROCEDURE TRAY) ×2 IMPLANT
KIT TURNOVER KIT B (KITS) ×2 IMPLANT
MARKER SKIN DUAL TIP RULER LAB (MISCELLANEOUS) ×2 IMPLANT
NDL INSUFFLATION 14GA 120MM (NEEDLE) ×2 IMPLANT
NDL SPNL 22GX3.5 QUINCKE BK (NEEDLE) IMPLANT
NEEDLE INSUFFLATION 14GA 120MM (NEEDLE) ×2
NEEDLE SPNL 22GX3.5 QUINCKE BK (NEEDLE)
NS IRRIG 1000ML POUR BTL (IV SOLUTION) ×2 IMPLANT
PAD ARMBOARD 7.5X6 YLW CONV (MISCELLANEOUS) ×4 IMPLANT
PENCIL BUTTON HOLSTER BLD 10FT (ELECTRODE) IMPLANT
SCISSORS LAP 5X35 DISP (ENDOMECHANICALS) IMPLANT
SET TUBE SMOKE EVAC HIGH FLOW (TUBING) ×2 IMPLANT
SHEARS HARMONIC ACE PLUS 36CM (ENDOMECHANICALS) IMPLANT
SLEEVE Z-THREAD 5X100MM (TROCAR) ×4 IMPLANT
SUT ETHIBOND CT1 BRD #0 30IN (SUTURE) IMPLANT
SUT MNCRL AB 4-0 PS2 18 (SUTURE) ×2 IMPLANT
SUT NOVA 1 T20/GS 25DT (SUTURE) IMPLANT
SUT NOVA NAB DX-16 0-1 5-0 T12 (SUTURE) ×2 IMPLANT
TOWEL GREEN STERILE (TOWEL DISPOSABLE) ×2 IMPLANT
TOWEL GREEN STERILE FF (TOWEL DISPOSABLE) ×2 IMPLANT
TRAY FOLEY W/BAG SLVR 16FR ST (SET/KITS/TRAYS/PACK) IMPLANT
TRAY LAPAROSCOPIC MC (CUSTOM PROCEDURE TRAY) ×2 IMPLANT
TROCAR 11X100 Z THREAD (TROCAR) IMPLANT
TROCAR BALLN 12MMX100 BLUNT (TROCAR) IMPLANT
TROCAR Z-THREAD OPTICAL 5X100M (TROCAR) ×2 IMPLANT
WARMER LAPAROSCOPE (MISCELLANEOUS) ×2 IMPLANT
WATER STERILE IRR 1000ML POUR (IV SOLUTION) ×2 IMPLANT

## 2023-06-19 NOTE — Anesthesia Preprocedure Evaluation (Addendum)
Anesthesia Evaluation  Patient identified by MRN, date of birth, ID band Patient awake    Reviewed: Allergy & Precautions, NPO status , Patient's Chart, lab work & pertinent test results  History of Anesthesia Complications Negative for: history of anesthetic complications  Airway Mallampati: II  TM Distance: >3 FB Neck ROM: Full    Dental  (+) Dental Advisory Given, Missing   Pulmonary Current SmokerPatient did not abstain from smoking.   breath sounds clear to auscultation       Cardiovascular hypertension (no medications), (-) angina  Rhythm:Regular Rate:Normal     Neuro/Psych negative neurological ROS     GI/Hepatic Neg liver ROS,GERD  Poorly Controlled,,  Endo/Other  negative endocrine ROS    Renal/GU negative Renal ROS     Musculoskeletal   Abdominal   Peds  Hematology negative hematology ROS (+)   Anesthesia Other Findings   Reproductive/Obstetrics                             Anesthesia Physical Anesthesia Plan  ASA: 2  Anesthesia Plan: General   Post-op Pain Management: Tylenol PO (pre-op)*   Induction: Intravenous  PONV Risk Score and Plan: 2 and Ondansetron and Dexamethasone  Airway Management Planned: Oral ETT  Additional Equipment: None  Intra-op Plan:   Post-operative Plan: Extubation in OR  Informed Consent: I have reviewed the patients History and Physical, chart, labs and discussed the procedure including the risks, benefits and alternatives for the proposed anesthesia with the patient or authorized representative who has indicated his/her understanding and acceptance.     Dental advisory given  Plan Discussed with: CRNA and Surgeon  Anesthesia Plan Comments:         Anesthesia Quick Evaluation

## 2023-06-19 NOTE — H&P (Signed)
    Rhonda Mann is an 47 y.o. female.   HPI: 67F with VH. Plan lap VHR today. The patient has had no hospitalizations, doctors visits, ER visits, surgeries, or newly diagnosed allergies since being seen in the office.    Past Medical History:  Diagnosis Date   Anemia    Gall stones    Hypertension    Smoker     Past Surgical History:  Procedure Laterality Date   CESAREAN SECTION WITH BILATERAL TUBAL LIGATION  2006   HERNIA REPAIR  2009   with mesh, done in [place]    Family History  Problem Relation Age of Onset   Cancer Father    Hypertension Father     Social History:  reports that she has been smoking cigarettes. She started smoking about 24 years ago. She has a 37.5 pack-year smoking history. She has never used smokeless tobacco. She reports that she does not currently use alcohol. She reports that she does not use drugs.  Allergies: No Known Allergies  Medications: I have reviewed the patient's current medications.  Results for orders placed or performed during the hospital encounter of 06/19/23 (from the past 48 hours)  Pregnancy, urine POC     Status: None   Collection Time: 06/19/23  9:59 AM  Result Value Ref Range   Preg Test, Ur NEGATIVE NEGATIVE    Comment:        THE SENSITIVITY OF THIS METHODOLOGY IS >24 mIU/mL     No results found.  ROS 10 point review of systems is negative except as listed above in HPI.   Physical Exam Blood pressure (!) 156/98, pulse 67, temperature 98.4 F (36.9 C), temperature source Oral, resp. rate 18, height 5\' 9"  (1.753 m), weight 83 kg, last menstrual period 06/18/2023, SpO2 99%. Constitutional: well-developed, well-nourished HEENT: pupils equal, round, reactive to light, 2mm b/l, moist conjunctiva, external inspection of ears and nose normal, hearing intact Oropharynx: normal oropharyngeal mucosa, normal dentition Neck: no thyromegaly, trachea midline, no midline cervical tenderness to palpation Chest: breath  sounds equal bilaterally, normal respiratory effort, no midline or lateral chest wall tenderness to palpation/deformity Abdomen: soft, NT, no bruising, no hepatosplenomegaly Skin: warm, dry, no rashes Psych: normal memory, normal mood/affect     Assessment/Plan: Ventral hernia - plan lap VHR with mesh. Informed consent was obtained after detailed explanation of risks, including bleeding, infection, hematoma/seroma, temporary or permanent neuropathy, hernia recurrence, and mesh infection requiring explantation. All questions answered to the patient's satisfaction. FEN - strict NPO DVT - SCDs, LMWH Dispo -  home post-op     Diamantina Monks, MD General and Trauma Surgery [facility] Surgery

## 2023-06-19 NOTE — Anesthesia Postprocedure Evaluation (Signed)
Anesthesia Post Note  Patient: Rhonda Mann  Procedure(s) Performed: OPEN INCISIONAL HERNIA REPAIR LAPAROSCOPY DIAGNOSTIC     Patient location during evaluation: PACU Anesthesia Type: General Level of consciousness: awake and alert, patient cooperative and oriented Pain management: pain level controlled Vital Signs Assessment: post-procedure vital signs reviewed and stable Respiratory status: spontaneous breathing, nonlabored ventilation and respiratory function stable Cardiovascular status: blood pressure returned to baseline and stable Postop Assessment: no apparent nausea or vomiting Anesthetic complications: no   No notable events documented.  Last Vitals:  Vitals:   06/19/23 1330 06/19/23 1445  BP: (!) 158/95 (!) 135/95  Pulse: 63 60  Resp: 20 16  Temp:  36.6 C  SpO2: 96% 94%    Last Pain:  Vitals:   06/19/23 1445  TempSrc:   PainSc: 7                  Rhonda Mann,E. Opie Fanton

## 2023-06-19 NOTE — Op Note (Signed)
   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  Description of procedure: The patient was positioned supine on the operating room table. General anesthetic induction and intubation were uneventful. Foley catheter insertion was performed and was atraumatic. Time-out was performed verifying correct patient, procedure, signature of informed consent, and administration of pre-operative antibiotics. The abdomen was prepped and draped in the usual sterile fashion.  The abdomen was entered using an optiview technique with a 5mm port in the left upper quadrant. The abdomen was insufflated and inspected, confirming no intra-abdominal injury on entry. Two additional 5mm ports placed under direct visualization in the left abdomen. Local anesthetic was infiltrated at each of these sites prior to port placement.  A piece of omentum was adherent to the abdominal wall and was taken down. No obvious fascial defect was noted. The abdomen was desufflated and an incision was made over the palpable area of incarceration. A large lobule of fat was resected, revealing a sub-centimeter hernia. This was repaired primarily using 0 vicryl suture in a figure of eight fashion.  Local anesthetic was infiltrated and the skin was closed using 4-0 Monocryl suture and Dermabond.  All sponge and instrument counts were correct at the conclusion of the procedure. The patient was awakened from anesthesia, extubated uneventfully, and transported to the PACU in  good condition. There were no complications.    Diamantina Monks, MD General and Trauma Surgery [facility]

## 2023-06-19 NOTE — Transfer of Care (Signed)
Immediate Anesthesia Transfer of Care Note  Patient: Rhonda Mann  Procedure(s) Performed: OPEN INCISIONAL HERNIA REPAIR LAPAROSCOPY DIAGNOSTIC  Patient Location: PACU  Anesthesia Type:General  Level of Consciousness: awake, alert , and oriented  Airway & Oxygen Therapy: Patient Spontanous Breathing and Patient connected to face mask oxygen  Post-op Assessment: Report given to RN and Post -op Vital signs reviewed and stable  Post vital signs: Reviewed and stable  Last Vitals:  Vitals Value Taken Time  BP 155/101 06/19/23 1324  Temp    Pulse 69 06/19/23 1325  Resp 14 06/19/23 1324  SpO2 95 % 06/19/23 1325  Vitals shown include unfiled device data.  Last Pain:  Vitals:   06/19/23 0947  TempSrc:   PainSc: 8       Patients Stated Pain Goal: 0 (06/19/23 0947)  Complications: No notable events documented.

## 2023-06-19 NOTE — Discharge Instructions (Signed)
[facility]  LAPAROSCOPIC SURGERY: POST OP INSTRUCTIONS Always review your discharge instruction sheet given to you by the facility where your surgery was performed. IF YOU HAVE DISABILITY OR FAMILY LEAVE FORMS, YOU MUST BRING THEM TO THE OFFICE FOR PROCESSING.   DO NOT GIVE THEM TO YOUR DOCTOR.  PAIN CONTROL  Pain regimen: take over-the-counter tylenol (acetaminophen) 1000mg  every six hours, the prescription ibuprofen (600mg ) every six hours and the robaxin (methocarbamol) 750mg  every six hours. With all three of these, you should be taking something every two hours. Example: tylenol (acetaminophen) at 8am, ibuprofen at 10am, robaxin (methocarbamol) at 12pm, tylenol (acetaminophen) again at 2pm, ibuprofen again at 4pm, robaxin (methocarbamol) at 6pm. You also have a prescription for oxycodone, which should be taken if the tylenol (acetaminophen), ibuprofen, and robaxin (methocarbamol) are not enough to control your pain. You may take the oxycodone as frequently as every four hours as needed, but if you are taking the other medications as above, you should not need the oxycodone this frequently. You have also been given a prescription for colace (docusate) which is a stool softener. Please take this as prescribed because the oxycodone can cause constipation and the colace (docusate) will minimize or prevent constipation. Do not drive while taking or under the influence of the oxycodone as it is a narcotic medication. Use ice packs to help control pain. If you need a refill on your pain medication, please contact your pharmacy.  They will contact our office to request authorization. Prescriptions will not be filled after 5pm or on week-ends.  HOME MEDICATIONS Take your usually prescribed medications unless otherwise directed.  DIET You should follow a light diet the first few days after arrival home.  Be sure to include lots of fluids daily.   CONSTIPATION It is common to  experience some constipation after surgery and if you are taking pain medication.  Increasing fluid intake and taking a stool softener (such as Colace) will usually help or prevent this problem from occurring.  A mild laxative (Miralax, over-the counter) should be taken according to package instructions if there are no bowel movements after 48 hours. If still no bowel movement 24 hours after taking Miralax, you may try magnesium citrate, available over the counter at a local pharmacy.   WOUND/INCISION CARE Most patients will experience some swelling and bruising in the area of the incisions.  Ice packs will help.  Swelling and bruising can take several days to resolve.  May shower beginning 06/20/23.  Do not peel off or scrub skin glue. May allow warm soapy water to run over incision, then rinse and pat dry.  Do not soak in any water (tubs, hot tubs, pools, lakes, oceans) for one week.   ACTIVITIES You may resume regular (light) daily activities beginning the next day--such as daily self-care, walking, climbing stairs--gradually increasing activities as tolerated.  You may have sexual intercourse when it is comfortable.   No lifting greater than 5 pounds for six weeks.  You may drive when you are no longer taking narcotic pain medication, you can comfortably wear a seatbelt, and you can safely maneuver your car and apply brakes.  FOLLOW-UP You should see your doctor in the office for a follow-up appointment approximately 2-3 weeks after your surgery.  You should have been given your post-op/follow-up appointment when your surgery was scheduled.  If you did not receive a post-op/follow-up appointment, make sure that you call for this appointment within a day or two after  you arrive home to ensure a convenient appointment time.  WHEN TO CALL YOUR DOCTOR: Fever over 101.5 Inability to urinate Continued bleeding from incision. Increased pain, redness, or drainage from the incision. Increasing  abdominal pain  The clinic staff is available to answer your questions during regular business hours.  Please don't hesitate to call and ask to speak to one of the nurses for clinical concerns.  If you have a medical emergency, go to the nearest emergency room or call 911.  A surgeon from [facility] is always on call at the hospital. [address], [place], [place]  [address] ? [address] [address], [place], [place]   [address] [phone] ? [phone] ? FAX [phone] Web site: [email or web address]

## 2023-06-19 NOTE — Anesthesia Procedure Notes (Signed)
Procedure Name: Intubation Date/Time: 06/19/2023 12:34 PM  Performed by: Thomasene Ripple, CRNAPre-anesthesia Checklist: Patient identified, Emergency Drugs available, Suction available and Patient being monitored Patient Re-evaluated:Patient Re-evaluated prior to induction Oxygen Delivery Method: Circle System Utilized Preoxygenation: Pre-oxygenation with 100% oxygen Induction Type: IV induction and Rapid sequence Laryngoscope Size: Miller and 3 Grade View: Grade I Tube type: Oral Tube size: 7.0 mm Number of attempts: 1 Airway Equipment and Method: Stylet and Oral airway Placement Confirmation: ETT inserted through vocal cords under direct vision, positive ETCO2 and breath sounds checked- equal and bilateral Tube secured with: Tape Dental Injury: Teeth and Oropharynx as per pre-operative assessment

## 2023-06-20 ENCOUNTER — Encounter (HOSPITAL_COMMUNITY): Payer: Self-pay | Admitting: Surgery

## 2023-07-09 ENCOUNTER — Ambulatory Visit: Payer: Medicaid Other | Admitting: Obstetrics and Gynecology

## 2023-07-09 NOTE — Progress Notes (Deleted)
   NEW GYNECOLOGY VISIT  Subjective:  Rhonda Mann is a 48 y.o. [id number] with LMP *** presenting for AUB  I personally reviewed the following: - Pap 08/06/20 NILM/HPV negative - CT A/P 04/01/23 uterus and bilateral adnexa are within normal limits  Menstrual Hx:  PMH: PSH: Meds: All: OB: Pap Hx:  Soc: FHx:  Objective:  There were no vitals filed for this visit.  General:  Alert, oriented and cooperative. Patient is in no acute distress.  Skin: Skin is warm and dry. No rash noted.   Cardiovascular: Normal heart rate noted  Respiratory: Normal respiratory effort, no problems with respiration noted  Abdomen: Soft, non-tender, non-distended   Pelvic: NEFG.   Exam performed in the presence of a chaperone  Assessment and Plan:  Rhonda Mann is a 48 y.o. with ***  There are no diagnoses linked to this encounter.  No follow-ups on file.  Future Appointments  Date Time Provider Department Center  07/09/2023  8:15 AM Erik Kieth BROCKS, MD [facility] None    Kieth BROCKS Erik, MD

## 2023-11-07 ENCOUNTER — Emergency Department (HOSPITAL_COMMUNITY)
Admission: EM | Admit: 2023-11-07 | Discharge: 2023-11-07 | Disposition: A | Discharge status: Home / Self Care | Attending: Emergency Medicine | Admitting: Emergency Medicine

## 2023-11-07 ENCOUNTER — Other Ambulatory Visit: Payer: Self-pay

## 2023-11-07 ENCOUNTER — Encounter (HOSPITAL_COMMUNITY): Payer: Self-pay | Admitting: Emergency Medicine

## 2023-11-07 DIAGNOSIS — K029 Dental caries, unspecified: Secondary | ICD-10-CM | POA: Diagnosis not present

## 2023-11-07 DIAGNOSIS — K0889 Other specified disorders of teeth and supporting structures: Secondary | ICD-10-CM | POA: Diagnosis present

## 2023-11-07 MED ORDER — AMOXICILLIN-POT CLAVULANATE 875-125 MG PO TABS: 1.0000 | ORAL_TABLET | Freq: Two times a day (BID) | ORAL | 0 refills | Status: DC

## 2023-11-07 MED ORDER — AMOXICILLIN-POT CLAVULANATE 875-125 MG PO TABS
1.0000 | ORAL_TABLET | Freq: Once | ORAL | Status: AC
  Administered 2023-11-07: 1 via ORAL
  Filled 2023-11-07: qty 1

## 2023-11-07 NOTE — ED Provider Notes (Signed)
  EMERGENCY DEPARTMENT AT [facility] Provider Note   CSN: [id number] Arrival date & time: 11/07/23  0865     History  Chief Complaint  Patient presents with   Dental Pain    Sa Fehrman is a 48 y.o. female.  48 year old female with prior medical history as detailed below presents for evaluation. She complains of pain primarily to the left lower molar.  This molar has significant decay.  She reports that she is scheduled for it to be removed by dentistry in June.  She denies fever.  She reports the pain has gotten worse over the last several days.  She is requesting a course of antibiotics.  The history is provided by the patient.       Home Medications Prior to Admission medications   Medication Sig Start Date End Date Taking? Authorizing Provider  ACCRUFER 30 MG CAPS Take 30 mg by mouth in the morning and at bedtime. 06/06/23   [provider]  acetaminophen  (TYLENOL ) 500 MG tablet Take 2 tablets (1,000 mg total) by mouth 4 (four) times daily. 06/19/23 06/18/24  Anda Bamberg, MD  docusate sodium  (COLACE) 100 MG capsule Take 1 capsule (100 mg total) by mouth 2 (two) times daily. 06/19/23 06/18/24  Anda Bamberg, MD  ibuprofen  (ADVIL ) 600 MG tablet Take 1 tablet (600 mg total) by mouth 4 (four) times daily. 06/19/23   Anda Bamberg, MD  methocarbamol  (ROBAXIN -750) 750 MG tablet Take 1 tablet (750 mg total) by mouth 4 (four) times daily. 06/19/23   Anda Bamberg, MD  norethindrone  (MICRONOR ) 0.35 MG tablet Take 1 tablet (0.35 mg total) by mouth daily. Patient taking differently: Take 1 tablet by mouth at bedtime. 08/06/20   Lillian Rein, MD  oxyCODONE  (ROXICODONE ) 5 MG immediate release tablet Take 1 tablet (5 mg total) by mouth every 4 (four) hours as needed. 06/19/23   Anda Bamberg, MD      Allergies    Patient has no known allergies.    Review of Systems   Review of Systems  All other systems reviewed and are  negative.   Physical Exam Updated Vital Signs BP (!) 157/91 (BP Location: Right Arm)   Pulse 74   Temp 98.6 F (37 C)   Resp 16   Ht 5\' 9"  (1.753 m)   Wt 81.6 kg   LMP 10/12/2023 (Approximate)   SpO2 100%   BMI 26.58 kg/m  Physical Exam Vitals and nursing note reviewed.  Constitutional:      General: She is not in acute distress.    Appearance: Normal appearance. She is well-developed.  HENT:     Head: Normocephalic and atraumatic.     Comments: Advanced dental decay of multiple teeth.  The left lower molar is significantly decayed.  There is no evidence of dental abscess. Eyes:     Conjunctiva/sclera: Conjunctivae normal.     Pupils: Pupils are equal, round, and reactive to light.  Cardiovascular:     Rate and Rhythm: Normal rate and regular rhythm.     Heart sounds: Normal heart sounds.  Pulmonary:     Effort: Pulmonary effort is normal. No respiratory distress.     Breath sounds: Normal breath sounds.  Abdominal:     General: There is no distension.     Palpations: Abdomen is soft.     Tenderness: There is no abdominal tenderness.  Musculoskeletal:        General: No deformity. Normal range  of motion.     Cervical back: Normal range of motion and neck supple.  Skin:    General: Skin is warm and dry.  Neurological:     General: No focal deficit present.     Mental Status: She is alert and oriented to person, place, and time.     ED Results / Procedures / Treatments   Labs (all labs ordered are listed, but only abnormal results are displayed) Labs Reviewed - No data to display  EKG None  Radiology No results found.  Procedures Procedures    Medications Ordered in ED Medications - No data to display  ED Course/ Medical Decision Making/ A&P                                 Medical Decision Making   Medical Screen Complete  This patient presented to the ED with complaint of dental pain.  This complaint involves an extensive number of treatment  options. The initial differential diagnosis includes, but is not limited to, dental infection  This presentation is: Acute, Chronic, Self-Limited, Previously Undiagnosed, and Uncertain Prognosis  Patient with known history of dental decay.  She is scheduled for dental extraction next month.  She complains of acute pain to his left lower molar.  This tooth is significantly decayed.  No surrounding soft tissue infection or abscess appreciated on exam.  Patient would benefit from course of antibiotics.  Importance of close follow-up stressed.  Strict return precautions given understood.  Additional history obtained:  External records from outside sources obtained and reviewed including prior ED visits and prior Inpatient records.    Problem List / ED Course:  Dental pain   Disposition:  After consideration of the diagnostic results and the patients response to treatment, I feel that the patent would benefit from close outpatient followup.          Final Clinical Impression(s) / ED Diagnoses Final diagnoses:  Pain, dental    Rx / DC Orders ED Discharge Orders          Ordered    amoxicillin -clavulanate (AUGMENTIN ) 875-125 MG tablet  Every 12 hours        11/07/23 0946              Burnette Carte, MD 11/07/23 [id number]

## 2023-11-07 NOTE — ED Triage Notes (Signed)
 Pt reports pain in her molars for the last several days radiating to her ears. Denies recent fever. NAD at present.

## 2023-11-07 NOTE — Discharge Instructions (Signed)
 Return for any problem.  ?

## 2024-02-23 ENCOUNTER — Other Ambulatory Visit (HOSPITAL_COMMUNITY)
Admission: RE | Admit: 2024-02-23 | Discharge: 2024-02-23 | Disposition: A | Discharge status: Home / Self Care | Source: Ambulatory Visit | Attending: Obstetrics and Gynecology | Admitting: Obstetrics and Gynecology

## 2024-02-23 ENCOUNTER — Encounter: Payer: Self-pay | Admitting: Obstetrics and Gynecology

## 2024-02-23 ENCOUNTER — Ambulatory Visit: Admitting: Obstetrics and Gynecology

## 2024-02-23 VITALS — BP 107/76 | HR 66 | Ht 69.0 in | Wt 180.0 lb

## 2024-02-23 DIAGNOSIS — Z113 Encounter for screening for infections with a predominantly sexual mode of transmission: Secondary | ICD-10-CM

## 2024-02-23 DIAGNOSIS — Z1211 Encounter for screening for malignant neoplasm of colon: Secondary | ICD-10-CM

## 2024-02-23 DIAGNOSIS — Z01419 Encounter for gynecological examination (general) (routine) without abnormal findings: Secondary | ICD-10-CM

## 2024-02-23 DIAGNOSIS — N939 Abnormal uterine and vaginal bleeding, unspecified: Secondary | ICD-10-CM | POA: Diagnosis not present

## 2024-02-23 DIAGNOSIS — Z1331 Encounter for screening for depression: Secondary | ICD-10-CM

## 2024-02-23 DIAGNOSIS — N643 Galactorrhea not associated with childbirth: Secondary | ICD-10-CM

## 2024-02-23 DIAGNOSIS — Z1231 Encounter for screening mammogram for malignant neoplasm of breast: Secondary | ICD-10-CM

## 2024-02-23 NOTE — Progress Notes (Signed)
 ANNUAL GYNECOLOGY VISIT Chief Complaint  Patient presents with   NEW PATIENT/ANNUAL     Subjective:  Rhonda Mann is a 48 y.o. [id number] who presents for annual and discussion of hysterectomy.  She reports that recently she has had very bad periods, sometimes with blood pouring down her legs when she is at the store. Typically will bleed for 5 days at a time, sometimes twice a month with only 1 week in between the periods. Notes she has been told she has fibroids or cysts before. Not on any medical management at this time. Takes iron.  Reports bilateral milky, nonbloody discharge ever since her last child was born (hx breastfeeding). Reports normal labs and negative head imaging for pituitary tumor with her PCP this year. Has never had a mammogram.  +smoker- 1 ppd  Hx hernia repair x 2, with mesh, umbilical Hx cesarean x 1   Gyn History: Patient's last menstrual period was 02/08/2024 (exact date). Contraception: bilateral tubal ligation Last pap:  Lab Results  Component Value Date   DIAGPAP  08/06/2020    - Negative for intraepithelial lesion or malignancy (NILM)   HPVHIGH Negative 08/06/2020   History of abnormal pap: No Periods: as above  Last mammogram: never Last colonoscopy: never     The pregnancy intention screening data noted above was reviewed. Potential methods of contraception were discussed. The patient elected to proceed with No data recorded.       02/23/2024    8:33 AM 08/06/2020   10:05 AM  Depression screen PHQ 2/9  Decreased Interest 0 1  Down, Depressed, Hopeless 0 0  PHQ - 2 Score 0 1  Altered sleeping 1 3  Tired, decreased energy 0 1  Change in appetite 0 2  Feeling bad or failure about yourself  0 0  Trouble concentrating 0 0  Moving slowly or fidgety/restless 0 0  Suicidal thoughts 0 0  PHQ-9 Score 1 7  Difficult doing work/chores Not difficult at all         02/23/2024    8:33 AM 08/06/2020   10:06 AM  GAD 7 : Generalized  Anxiety Score  Nervous, Anxious, on Edge 1 0  Control/stop worrying 0 0  Worry too much - different things 0 0  Trouble relaxing 1 0  Restless 1 0  Easily annoyed or irritable 0 0  Afraid - awful might happen 0 0  Total GAD 7 Score 3 0  Anxiety Difficulty Not difficult at all       OB History     Gravida  5   Para  5   Term  5   Preterm  0   AB  0   Living  5      SAB  0   IAB  0   Ectopic  0   Multiple  0   Live Births  5           Past Medical History:  Diagnosis Date   Anemia    Arthritis    Fibroid    Gall stones    Hypertension    Preterm labor    Smoker     Past Surgical History:  Procedure Laterality Date   CESAREAN SECTION     CESAREAN SECTION WITH BILATERAL TUBAL LIGATION  2006   HERNIA REPAIR  2009   with mesh, done in [place]    INCISIONAL HERNIA REPAIR N/A 06/19/2023   Procedure: OPEN INCISIONAL HERNIA REPAIR;  Surgeon: Paola Bolk  N, MD;  Location: [facility] OR;  Service: General;  Laterality: N/A;   LAPAROSCOPY  06/19/2023   Procedure: LAPAROSCOPY DIAGNOSTIC;  Surgeon: Paola Dreama SAILOR, MD;  Location: [facility] OR;  Service: General;;   TUBAL LIGATION      Social History   Socioeconomic History   Marital status: Legally Separated    Spouse name: Not on file   Number of children: 5   Years of education: Not on file   Highest education level: Not on file  Occupational History   Not on file  Tobacco Use   Smoking status: Every Day    Current packs/day: 1.00    Average packs/day: 0.8 packs/day for 50.6 years (38.1 ttl pk-yrs)    Types: Cigarettes    Start date: 2000   Smokeless tobacco: Never  Vaping Use   Vaping status: Never Used  Substance and Sexual Activity   Alcohol use: Not Currently    Comment: occassionally every 3 months    Drug use: Never   Sexual activity: Not Currently    Birth control/protection: Surgical  Other Topics Concern   Not on file  Social History Narrative   Not on file   Social Drivers of Health    Financial Resource Strain: Not on file  Food Insecurity: Not on file  Transportation Needs: Not on file  Physical Activity: Not on file  Stress: Not on file  Social Connections: Not on file    Family History  Problem Relation Age of Onset   Cancer Father    Hypertension Father     Current Outpatient Medications on File Prior to Visit  Medication Sig Dispense Refill   ACCRUFER 30 MG CAPS Take 30 mg by mouth in the morning and at bedtime.     lisinopril (ZESTRIL) 40 MG tablet Take 40 mg by mouth daily.     acetaminophen  (TYLENOL ) 500 MG tablet Take 2 tablets (1,000 mg total) by mouth 4 (four) times daily. 120 tablet 3   amoxicillin -clavulanate (AUGMENTIN ) 875-125 MG tablet Take 1 tablet by mouth every 12 (twelve) hours. 14 tablet 0   docusate sodium  (COLACE) 100 MG capsule Take 1 capsule (100 mg total) by mouth 2 (two) times daily. 60 capsule 2   ibuprofen  (ADVIL ) 600 MG tablet Take 1 tablet (600 mg total) by mouth 4 (four) times daily. 120 tablet 1   methocarbamol  (ROBAXIN -750) 750 MG tablet Take 1 tablet (750 mg total) by mouth 4 (four) times daily. 120 tablet 1   norethindrone  (MICRONOR ) 0.35 MG tablet Take 1 tablet (0.35 mg total) by mouth daily. (Patient taking differently: Take 1 tablet by mouth at bedtime.) 28 tablet 3   oxyCODONE  (ROXICODONE ) 5 MG immediate release tablet Take 1 tablet (5 mg total) by mouth every 4 (four) hours as needed. 15 tablet 0   No current facility-administered medications on file prior to visit.    No Known Allergies   Objective:   Vitals:   02/23/24 0815  BP: 107/76  Pulse: 66  Weight: 180 lb (81.6 kg)  Height: 5' 9 (1.753 m)   Physical Examination:   General appearance - well appearing, and in no distress  Mental status - alert, oriented to person, place, and time  Psych:  normal mood and affect  Skin - warm and dry, normal color, no suspicious lesions noted  Chest - effort normal, all lung fields clear to auscultation  bilaterally  Heart - normal rate and regular rhythm  Neck:  midline trachea, no thyromegaly or nodules  Breasts - breasts appear normal, no suspicious masses, no skin or nipple changes or  axillary nodes  Abdomen - soft, nontender, nondistended, no masses or organomegaly  Pelvic -  VULVA: normal appearing vulva with no masses, tenderness or lesions   VAGINA: normal appearing vagina with normal color and discharge, no lesions   CERVIX: normal appearing cervix without discharge or lesions, no CMT  Thin prep pap is not done    UTERUS: uterus is felt to be normal size, shape, consistency and nontender   ADNEXA: No adnexal masses or tenderness noted.  Extremities:  No swelling or varicosities noted  Chaperone present for exam  Assessment and Plan:  1. Women's annual routine gynecological examination [Z01.419] (Primary) Pap due 2027 Mammo ordered Colonoscopy referral BTL for contraception STI screening  2. Encounter for screening mammogram for malignant neoplasm of breast - MM 3D SCREENING MAMMOGRAM BILATERAL BREAST; Future  3. Routine screening for STI (sexually transmitted infection) - HIV antibody (with reflex) - Hepatitis B Surface AntiGEN - Hepatitis C Antibody - Cervicovaginal ancillary only - RPR  4. Colon cancer screening - Ambulatory referral to Gastroenterology  5. Galactorrhea Likely physiologic. Will check TSH, prolactin and order mammogram - Prolactin - TSH Rfx on Abnormal to Free T4 - MM 3D SCREENING MAMMOGRAM BILATERAL BREAST; Future  6. Abnormal uterine bleeding Likely due to reported fibroids vs perimenopausal. Recommend CBC, pelvic ultrasound and f/u for EMB. Discuss management options pending all of the above. Reviewed surgical risks of hysterectomy given prior umbilical hernia repair with mesh as well as other surgeries. Also reviewed risks of VTE and poor wound healing with smoking. Will consider options further at follow up -- f/u for EMB - US  PELVIC  COMPLETE WITH TRANSVAGINAL; Future - CBC    No follow-ups on file.  No future appointments.  Rollo ONEIDA Bring, MD, FACOG Obstetrician & Gynecologist, [facility] for [facility], [facility]

## 2024-02-23 NOTE — Progress Notes (Signed)
 48 y.o. New GYN presents for AEX/PAP/STD screening.  C/o heavy periods, painful periods 7/10, blood clots, fibroids, cyst.  Pt wants to get a Hysterectomy

## 2024-02-24 ENCOUNTER — Ambulatory Visit: Payer: Self-pay | Admitting: Obstetrics and Gynecology

## 2024-02-24 LAB — HEPATITIS C ANTIBODY: Hep C Virus Ab: NONREACTIVE

## 2024-02-24 LAB — CBC
Hematocrit: 44.4 % (ref 34.0–46.6)
Hemoglobin: 14.7 g/dL (ref 11.1–15.9)
MCH: 31.8 pg (ref 26.6–33.0)
MCHC: 33.1 g/dL (ref 31.5–35.7)
MCV: 96 fL (ref 79–97)
Platelets: 249 x10E3/uL (ref 150–450)
RBC: 4.62 x10E6/uL (ref 3.77–5.28)
RDW: 12.3 % (ref 11.7–15.4)
WBC: 7.7 x10E3/uL (ref 3.4–10.8)

## 2024-02-24 LAB — CERVICOVAGINAL ANCILLARY ONLY
Chlamydia: NEGATIVE
Comment: NEGATIVE
Comment: NEGATIVE
Comment: NORMAL
Neisseria Gonorrhea: NEGATIVE
Trichomonas: NEGATIVE

## 2024-02-24 LAB — PROLACTIN: Prolactin: 5.1 ng/mL (ref 4.8–33.4)

## 2024-02-24 LAB — HEPATITIS B SURFACE ANTIGEN: Hepatitis B Surface Ag: NEGATIVE

## 2024-02-24 LAB — TSH RFX ON ABNORMAL TO FREE T4: TSH: 0.52 u[IU]/mL (ref 0.450–4.500)

## 2024-02-24 LAB — HIV ANTIBODY (ROUTINE TESTING W REFLEX): HIV Screen 4th Generation wRfx: NONREACTIVE

## 2024-02-24 LAB — RPR: RPR Ser Ql: NONREACTIVE

## 2024-03-03 ENCOUNTER — Ambulatory Visit

## 2024-03-09 ENCOUNTER — Ambulatory Visit (HOSPITAL_COMMUNITY)

## 2024-03-11 ENCOUNTER — Ambulatory Visit

## 2024-03-16 ENCOUNTER — Other Ambulatory Visit: Admitting: Obstetrics and Gynecology

## 2024-03-18 ENCOUNTER — Ambulatory Visit (HOSPITAL_COMMUNITY)
Admission: RE | Admit: 2024-03-18 | Discharge: 2024-03-18 | Disposition: A | Discharge status: Home / Self Care | Source: Ambulatory Visit | Attending: Obstetrics and Gynecology | Admitting: Obstetrics and Gynecology

## 2024-03-18 DIAGNOSIS — N939 Abnormal uterine and vaginal bleeding, unspecified: Secondary | ICD-10-CM | POA: Diagnosis present

## 2024-03-24 ENCOUNTER — Encounter: Payer: Self-pay | Admitting: Obstetrics and Gynecology

## 2024-03-25 ENCOUNTER — Ambulatory Visit

## 2024-04-04 ENCOUNTER — Ambulatory Visit: Admitting: Podiatry

## 2024-04-11 ENCOUNTER — Ambulatory Visit (INDEPENDENT_AMBULATORY_CARE_PROVIDER_SITE_OTHER): Admitting: Family Medicine

## 2024-04-11 ENCOUNTER — Other Ambulatory Visit (HOSPITAL_COMMUNITY)
Admission: RE | Admit: 2024-04-11 | Discharge: 2024-04-11 | Disposition: A | Discharge status: Home / Self Care | Source: Ambulatory Visit | Attending: Family Medicine | Admitting: Family Medicine

## 2024-04-11 VITALS — BP 126/81 | HR 86 | Wt 177.0 lb

## 2024-04-11 DIAGNOSIS — N939 Abnormal uterine and vaginal bleeding, unspecified: Secondary | ICD-10-CM

## 2024-04-11 NOTE — Progress Notes (Signed)
 Pt is in office for endometrial biopsy.

## 2024-04-11 NOTE — Progress Notes (Signed)
    GYNECOLOGY CLINIC ENDOMETRIAL BIOPSY PROCEDURE NOTE  Rhonda Mann is a y 48 y.o. [id number] here for endometrial biopsy for abnormal uterine bleeding.  Patient previously seen for issues with extremely heavy periods lasting for 5 days at a time and are sometimes twice a month.Ultrasound performed showing asymmetric thickened endometrium within the fundus measuring up to 22 mm which was nonspecific but may be related to an endometrial polyp.  Endometrial sampling was recommended.  There was a subserosal uterine fibroid noted in the posterior fundus.  Discussed nature of the procedure and risks and benefits.  Pregnancy test: Negative, patient has history of salpingectomy Lab Results  Component Value Date   PREGTESTUR NEGATIVE 06/19/2023    No Known Allergies  Patient given informed consent, signed copy in the chart, time out was performed.    The patient was placed in the lithotomy position and the cervix brought into view with sterile speculum.  Cervix cleansed x 2 with betadine swabs. A tenaculum was placed in the anterior lip of the cervix. The uterus was sounded for depth of 7. A pipelle was introduced to into the uterus, suction created,  and an endometrial sample was obtained.  3 passes were performed.  All equipment was removed and accounted for.  The patient tolerated the procedure well.   Patient given post procedure instructions.  Per patient preference she will be notified of results by telephone for abnormal results or MyChart for anything else..  Rhonda Swiney V [name], MD Attending Family Medicine Physician, [facility] for [facility], [facility]

## 2024-04-12 LAB — SURGICAL PATHOLOGY

## 2024-04-14 ENCOUNTER — Ambulatory Visit: Payer: Self-pay | Admitting: Family Medicine

## 2024-04-15 ENCOUNTER — Ambulatory Visit: Admitting: Podiatry

## 2024-05-23 ENCOUNTER — Ambulatory Visit (HOSPITAL_COMMUNITY)

## 2024-05-31 ENCOUNTER — Ambulatory Visit: Admitting: Obstetrics and Gynecology

## 2024-06-09 ENCOUNTER — Emergency Department (HOSPITAL_COMMUNITY)
Admission: EM | Admit: 2024-06-09 | Discharge: 2024-06-09 | Disposition: A | Discharge status: Home / Self Care | Attending: Emergency Medicine | Admitting: Emergency Medicine

## 2024-06-09 ENCOUNTER — Other Ambulatory Visit: Payer: Self-pay

## 2024-06-09 DIAGNOSIS — K029 Dental caries, unspecified: Secondary | ICD-10-CM | POA: Insufficient documentation

## 2024-06-09 DIAGNOSIS — K0889 Other specified disorders of teeth and supporting structures: Secondary | ICD-10-CM

## 2024-06-09 MED ORDER — NAPROXEN 500 MG PO TABS: 500.0000 mg | ORAL_TABLET | Freq: Two times a day (BID) | ORAL | 0 refills | Status: DC

## 2024-06-09 MED ORDER — LIDOCAINE VISCOUS HCL 2 % MT SOLN
15.0000 mL | Freq: Once | OROMUCOSAL | Status: AC
  Administered 2024-06-09: 15 mL via OROMUCOSAL
  Filled 2024-06-09: qty 15

## 2024-06-09 MED ORDER — LIDOCAINE VISCOUS HCL 2 % MT SOLN: 5.0000 mL | Freq: Three times a day (TID) | OROMUCOSAL | 0 refills | Status: DC | PRN

## 2024-06-09 MED ORDER — PENICILLIN V POTASSIUM 500 MG PO TABS: 500.0000 mg | ORAL_TABLET | Freq: Four times a day (QID) | ORAL | 0 refills | Status: AC

## 2024-06-09 MED ORDER — NAPROXEN 500 MG PO TABS
500.0000 mg | ORAL_TABLET | Freq: Once | ORAL | Status: AC
  Administered 2024-06-09: 500 mg via ORAL
  Filled 2024-06-09: qty 1

## 2024-06-09 MED ORDER — PENICILLIN V POTASSIUM 500 MG PO TABS
500.0000 mg | ORAL_TABLET | Freq: Once | ORAL | Status: AC
  Administered 2024-06-09: 500 mg via ORAL
  Filled 2024-06-09: qty 1

## 2024-06-09 NOTE — ED Triage Notes (Signed)
 Patient in with c/o toothache that started 3am this morning without any relief. Patient have not been to dentist

## 2024-06-09 NOTE — ED Provider Notes (Signed)
 [place] EMERGENCY DEPARTMENT AT [facility] Provider Note   CSN: [id number] Arrival date & time: 06/09/24  9368     Patient presents with: Dental Pain   Rhonda Mann is a 48 y.o. female.   The history is provided by the patient.  Dental Pain Location:  Upper Upper teeth location:  1/RU 3rd molar Quality:  Aching Severity:  Severe Onset quality:  Sudden Timing:  Constant Progression:  Unchanged Chronicity:  Recurrent Context: dental caries, dental fracture, enamel fracture and poor dentition   Previous work-up:  Dental exam Relieved by:  Nothing Worsened by:  Nothing Associated symptoms: no congestion, no difficulty swallowing, no drooling, no facial swelling, no fever, no neck swelling, no oral bleeding, no oral lesions and no trismus   Risk factors: no diabetes        Prior to Admission medications  Medication Sig Start Date End Date Taking? Authorizing Provider  magic mouthwash (lidocaine , diphenhydrAMINE, alum & mag hydroxide) suspension Swish and spit 5 mLs 3 (three) times daily as needed for mouth pain. 06/09/24  Yes Gabby Rackers, MD  naproxen  (NAPROSYN ) 500 MG tablet Take 1 tablet (500 mg total) by mouth 2 (two) times daily with a meal. 06/09/24  Yes Eitan Doubleday, MD  penicillin  v potassium (VEETID) 500 MG tablet Take 1 tablet (500 mg total) by mouth 4 (four) times daily for 7 days. 06/09/24 06/16/24 Yes Jerrilynn Mikowski, MD  ACCRUFER 30 MG CAPS Take 30 mg by mouth in the morning and at bedtime. 06/06/23   [provider]  acetaminophen  (TYLENOL ) 500 MG tablet Take 2 tablets (1,000 mg total) by mouth 4 (four) times daily. 06/19/23 06/18/24  Paola Dreama SAILOR, MD  amoxicillin -clavulanate (AUGMENTIN ) 875-125 MG tablet Take 1 tablet by mouth every 12 (twelve) hours. 11/07/23   Laurice Maude BROCKS, MD  docusate sodium  (COLACE) 100 MG capsule Take 1 capsule (100 mg total) by mouth 2 (two) times daily. 06/19/23 06/18/24  Paola Dreama SAILOR, MD   ibuprofen  (ADVIL ) 600 MG tablet Take 1 tablet (600 mg total) by mouth 4 (four) times daily. 06/19/23   Paola Dreama SAILOR, MD  lisinopril (ZESTRIL) 40 MG tablet Take 40 mg by mouth daily. 01/12/24   [provider]  methocarbamol  (ROBAXIN -750) 750 MG tablet Take 1 tablet (750 mg total) by mouth 4 (four) times daily. 06/19/23   Paola Dreama SAILOR, MD  norethindrone  (MICRONOR ) 0.35 MG tablet Take 1 tablet (0.35 mg total) by mouth daily. Patient taking differently: Take 1 tablet by mouth at bedtime. 08/06/20   Cleotilde Ronal RAMAN, MD  oxyCODONE  (ROXICODONE ) 5 MG immediate release tablet Take 1 tablet (5 mg total) by mouth every 4 (four) hours as needed. 06/19/23   Paola Dreama SAILOR, MD    Allergies: Patient has no known allergies.    Review of Systems  Constitutional:  Negative for fever.  HENT:  Positive for dental problem. Negative for congestion, drooling, facial swelling and mouth sores.   All other systems reviewed and are negative.   Updated Vital Signs BP (!) 143/90 (BP Location: Left Arm)   Pulse 75   Temp 98.8 F (37.1 C) (Oral)   Resp 17   Ht 5' 8 (1.727 m)   Wt 79.4 kg   SpO2 99%   BMI 26.61 kg/m   Physical Exam Vitals and nursing note reviewed.  Constitutional:      General: She is not in acute distress.    Appearance: Normal appearance. She is well-developed.  HENT:  Head: Normocephalic and atraumatic.     Nose: Nose normal.     Mouth/Throat:     Mouth: Mucous membranes are moist.     Pharynx: Oropharynx is clear.     Comments: Multiple caries upper and lower RU third molar broken at gum line  Eyes:     Pupils: Pupils are equal, round, and reactive to light.  Cardiovascular:     Rate and Rhythm: Normal rate and regular rhythm.     Pulses: Normal pulses.     Heart sounds: Normal heart sounds.  Pulmonary:     Effort: Pulmonary effort is normal. No respiratory distress.     Breath sounds: Normal breath sounds.  Abdominal:     General: Bowel sounds are  normal. There is no distension.     Palpations: Abdomen is soft.     Tenderness: There is no abdominal tenderness. There is no guarding or rebound.  Musculoskeletal:        General: Normal range of motion.     Cervical back: Neck supple.  Skin:    General: Skin is dry.     Capillary Refill: Capillary refill takes less than 2 seconds.     Findings: No erythema or rash.  Neurological:     General: No focal deficit present.     Mental Status: She is alert and oriented to person, place, and time.     Deep Tendon Reflexes: Reflexes normal.  Psychiatric:        Mood and Affect: Mood normal.     (all labs ordered are listed, but only abnormal results are displayed) Labs Reviewed - No data to display  EKG: None  Radiology: No results found.   Procedures   Medications Ordered in the ED  penicillin  v potassium (VEETID) tablet 500 mg (has no administration in time range)  lidocaine  (XYLOCAINE ) 2 % viscous mouth solution 15 mL (has no administration in time range)  naproxen  (NAPROSYN ) tablet 500 mg (has no administration in time range)                                    Medical Decision Making Patient with recurrent dental pain at a known broken tooth site   Amount and/or Complexity of Data Reviewed External Data Reviewed: notes.    Details: Previous notes reviewed   Risk Prescription drug management. Risk Details: Well appearing.  No trismus.  No facial swelling.  Has other caries.  Antibiotics initiated.  Will need to follow up with dentistry for definitive care.  Stable for discharge.  Strict returns.       Final diagnoses:  Pain, dental  Pain due to dental caries  The patient is nontoxic-appearing on exam and vital signs are within normal limits.  I have reviewed the triage vital signs and the nursing notes. Pertinent labs & imaging results that were available during my care of the patient were reviewed by me and considered in my medical decision making (see chart  for details). After history, exam, and medical workup I feel the patient has been appropriately medically screened and is safe for discharge home. Pertinent diagnoses were discussed with the patient. Patient was given return precautions.   ED Discharge Orders          Ordered    penicillin  v potassium (VEETID) 500 MG tablet  4 times daily        06/09/24 [id number]  naproxen  (NAPROSYN ) 500 MG tablet  2 times daily with meals        06/09/24 0647    magic mouthwash (lidocaine , diphenhydrAMINE, alum & mag hydroxide) suspension  3 times daily PRN        06/09/24 0647               Omari Koslosky, MD 06/09/24 9347

## 2024-06-14 ENCOUNTER — Ambulatory Visit: Admitting: Obstetrics and Gynecology

## 2024-07-05 ENCOUNTER — Ambulatory Visit: Admitting: Obstetrics and Gynecology

## 2024-07-05 ENCOUNTER — Telehealth: Admitting: Physician Assistant

## 2024-07-05 VITALS — BP 122/76 | Wt 182.6 lb

## 2024-07-05 DIAGNOSIS — N939 Abnormal uterine and vaginal bleeding, unspecified: Secondary | ICD-10-CM | POA: Diagnosis not present

## 2024-07-05 DIAGNOSIS — L709 Acne, unspecified: Secondary | ICD-10-CM | POA: Diagnosis not present

## 2024-07-05 MED ORDER — CLINDAMYCIN PHOS (TWICE-DAILY) 1 % EX GEL: Freq: Two times a day (BID) | CUTANEOUS | 0 refills | Status: AC

## 2024-07-05 NOTE — Progress Notes (Signed)
" ° °  ESTABLISHED GYNECOLOGY VISIT Chief Complaint  Patient presents with   Follow-up    Present for results from bx and to see if she can get a hysterectomy     Subjective:  Rhonda Mann is a 49 y.o. [id number] presenting for abnormal uterine bleeding follow up.  She is not currently on medical management. She has had an ultrasound and EMB since her last visit. Ultrasound showed asymmetric endometrium measuring up to 22 mm and a small subserosal fibroid. EMB was benign polypoid tissue. She is interested in hysterectomy.  Lab Results  Component Value Date   WBC 7.7 02/23/2024   HGB 14.7 02/23/2024   HCT 44.4 02/23/2024   MCV 96 02/23/2024   PLT 249 02/23/2024    +smoker- 1 ppd   Hx hernia repair x 2, with mesh, umbilical Hx cesarean x 1  Pelvic ultrasound 03/18/24: IMPRESSION: 1. Asymmetrically thickened endometrium within the fundus, measuring up to 22 mm, nonspecific, possibly related to an underlying endometrial polyp or endometrial hyperplasia. Endometrial sampling over sonohysterography may be helpful for further evaluation. 2. Subserosal uterine fibroid in the posterior fundus, measuring 1.3 x 1.2 x 1.3 cm.  Endometrial biopsy 03/2024: FINAL MICROSCOPIC DIAGNOSIS:  A. ENDOMETRIUM, BIOPSY:  - Benign proliferative polypoid endometrium  - Negative for hyperplasia or malignancy      Review of Systems:   Pertinent items are noted in HPI  Pertinent History Reviewed:  Reviewed past medical,surgical, social and family history.  Reviewed problem list, medications and allergies.  Objective:   Vitals:   07/05/24 0811  BP: 122/76  Weight: 182 lb 9.6 oz (82.8 kg)   Physical Examination:   General appearance - well appearing, and in no distress  Mental status - alert, oriented to person, place, and time  Psych:  normal mood and affect  Skin - warm and dry, normal color, no suspicious lesions noted    Assessment and Plan:  1. Abnormal uterine bleeding  (Primary) Reviewed results with patient in detail. Reviewed options for management including medical with progesterone therapy or IUD vs surgical with hysteroscopy/polypectomy/D&C vs hysterectomy. She is interested in hysterectomy. I expressed my concerns regarding her risk of complications with prior umbilical hernia repair x 2 (one with mesh) in addition to prior c-section and current smoker. We reviewed that smoking can impact wound healing and increase her risk of cuff dehiscence.   I think she would be a good candidate for hysteroscopy, D&C with possible polypectomy given noted polypoid tissue and 22 mm endometrium of ultrasound. We can also place LNG IUD at the time of surgery. We reviewed decrease in menstrual flow and rates of amenorrhea with LNG IUD. She is interested in this option. We reviewed risks of surgery including bleeding with rare risk of blood transfusion or hysterectomy, infection, damage to internal organs, medical complications like VTE, MI, CVA or death. She verbalized her understnading. Surgical request placed. - Ambulatory Referral For Surgery Scheduling   Future Appointments  Date Time Provider Department Center  07/05/2024  8:35 AM Shahmeer Bunn, Rollo DASEN, MD [facility] None    Rollo DASEN Bring, MD, FACOG Obstetrician & Gynecologist, [facility] for [facility], [facility] "

## 2024-07-05 NOTE — Progress Notes (Signed)
 We are sorry that you are experiencing this issue.  Here is how we plan to help!  Based on what you shared with me it looks like you have uncomplicated acne.  Acne is a disorder of the hair follicles and oil glands (sebaceous glands). The sebaceous glands secrete oils to keep the skin moist.  When the glands get clogged, it can lead to pimples or cysts.  These cysts may become infected and leave scars. Acne is very common and normally occurs at puberty.  Acne is also inherited.  Your personal care plan consists of the following recommendations:  I recommend that you use a daily cleanser  You might try an over the counter cleanser that has benzoyl peroxide.  I recommend that you start with a product that has 2.5% benzoyl peroxide.  Stronger concentrations have not been shown to be more effective.  I have prescribed a topical gel with an antibiotic:  Clindamycin  1% lotion.  Apply the lotion to the affected skin twice daily.  Be sure to read the package insert to understand potential side effects,  If excessive dryness or peeling occurs, reduce dose frequency or concentration of the topical scrubs.  If excessive stinging or burning occurs, remove the topical gel with mild soap and water and resume at a lower dose the next day.  Remember oral antibiotics and topical acne treatments may increase your sensitivity to the sun!  HOME CARE: Do not squeeze pimples because that can often lead to infections, worse acne, and scars. Use a moisturizer that contains retinoid or fruit acids that may inhibit the development of new acne lesions. Although there is not a clear link that foods can cause acne, doctors do believe that too many sweets predispose you to skin problems.  GET HELP RIGHT AWAY IF: If your acne gets worse or is not better within 10 days. If you become depressed. If you become pregnant, discontinue medications and call your OB/GYN.  MAKE SURE YOU: Understand these instructions. Will watch  your condition. Will get help right away if you are not doing well or get worse.   Your e-visit answers were reviewed by a board certified advanced clinical practitioner to complete your personal care plan.  Depending upon the condition, your plan could have included both over the counter or prescription medications.  Please review your pharmacy choice.  If there is a problem, you may contact your provider through [employer] and have the prescription routed to another pharmacy.  Your safety is important to us .  If you have drug allergies check your prescription carefully.  For the next 24 hours you can use MyChart to ask questions about today's visit, request a non-urgent call back, or ask for a work or school excuse from your e-visit provider.  You will get an email in the next two days asking about your experience. I hope that your e-visit has been valuable and will speed your recovery.  I have spent 5 minutes in review of e-visit questionnaire, review and updating patient chart, medical decision making and response to patient.   Elsie Velma Lunger, PA-C

## 2024-07-08 ENCOUNTER — Telehealth: Payer: Self-pay

## 2024-07-08 NOTE — Telephone Encounter (Signed)
 Patient agreed to surgery w/ Dr. Abigail on 07/26/24 at 1:30 pm at [facility]. Pre-op instructions and surgery details were discussed by phone.

## 2024-07-11 ENCOUNTER — Telehealth: Admitting: Physician Assistant

## 2024-07-11 DIAGNOSIS — K0889 Other specified disorders of teeth and supporting structures: Secondary | ICD-10-CM

## 2024-07-11 DIAGNOSIS — Z98818 Other dental procedure status: Secondary | ICD-10-CM

## 2024-07-11 NOTE — Progress Notes (Signed)
" °  Because of having recent dental extraction and pain around the stitching, I feel your condition warrants further evaluation and I recommend that you be seen in a face-to-face visit for a thorough dental evaluation and to determine if you may have dry socket.   NOTE: There will be NO CHARGE for this E-Visit   If you are having a true medical emergency, please call 911.     For an urgent face to face visit, [place] has multiple urgent care centers for your convenience.  Click the link below for the full list of locations and hours, walk-in wait times, appointment scheduling options and driving directions:  Urgent Care - Center, [place], [place], [place], [place], [place]  [place]     Your MyChart E-visit questionnaire answers were reviewed by a board certified advanced clinical practitioner to complete your personal care plan based on your specific symptoms.    Thank you for using e-Visits.     "

## 2024-07-13 ENCOUNTER — Ambulatory Visit: Admitting: Podiatry

## 2024-07-21 ENCOUNTER — Other Ambulatory Visit: Payer: Self-pay | Admitting: Obstetrics and Gynecology

## 2024-07-22 NOTE — Progress Notes (Signed)
 Spoke w/ via phone for pre-op interview---[name]----    UPT     Lab results------ COVID test -----patient states asymptomatic no test needed Arrive at -------1200 NPO after MN NO Solid Food.  Clear liquids from MN until---1100 Pre-Surgery Ensure or G2:  Med rec completed Medications to take morning of surgery -----N/A Diabetic medication -----  GLP1 agonist last dose: GLP1 instructions:  Patient instructed no nail polish to be worn day of surgery Patient instructed to bring photo id and insurance card day of surgery Patient aware to have Driver (ride ) / caregiver    for 24 hours after surgery - Friend Rhonda Mann(driver) Caregiver will be brother Patient Special Instructions ----- Pre-Op special Instructions -----  Patient verbalized understanding of instructions that were given at this phone interview. Patient denies chest pain, sob, fever, cough at the interview.

## 2024-07-25 ENCOUNTER — Other Ambulatory Visit: Payer: Self-pay

## 2024-07-25 ENCOUNTER — Emergency Department (HOSPITAL_COMMUNITY)
Admission: EM | Admit: 2024-07-25 | Discharge: 2024-07-25 | Disposition: A | Discharge status: Home / Self Care | Attending: Emergency Medicine | Admitting: Emergency Medicine

## 2024-07-25 ENCOUNTER — Encounter (HOSPITAL_COMMUNITY): Payer: Self-pay | Admitting: Emergency Medicine

## 2024-07-25 DIAGNOSIS — K047 Periapical abscess without sinus: Secondary | ICD-10-CM | POA: Diagnosis present

## 2024-07-25 DIAGNOSIS — I10 Essential (primary) hypertension: Secondary | ICD-10-CM | POA: Diagnosis not present

## 2024-07-25 DIAGNOSIS — Z87891 Personal history of nicotine dependence: Secondary | ICD-10-CM | POA: Insufficient documentation

## 2024-07-25 DIAGNOSIS — Z79899 Other long term (current) drug therapy: Secondary | ICD-10-CM | POA: Insufficient documentation

## 2024-07-25 MED ORDER — AMOXICILLIN-POT CLAVULANATE 875-125 MG PO TABS
1.0000 | ORAL_TABLET | Freq: Once | ORAL | Status: AC
  Administered 2024-07-25: 1 via ORAL
  Filled 2024-07-25: qty 1

## 2024-07-25 MED ORDER — AMOXICILLIN-POT CLAVULANATE 875-125 MG PO TABS: 1.0000 | ORAL_TABLET | Freq: Two times a day (BID) | ORAL | 0 refills | Status: AC

## 2024-07-25 MED ORDER — CHLORHEXIDINE GLUCONATE 0.12 % MT SOLN: 15.0000 mL | Freq: Two times a day (BID) | OROMUCOSAL | 0 refills | Status: AC

## 2024-07-25 MED ORDER — IBUPROFEN 600 MG PO TABS: 600.0000 mg | ORAL_TABLET | Freq: Four times a day (QID) | ORAL | 0 refills | Status: AC | PRN

## 2024-07-25 MED ORDER — IBUPROFEN 400 MG PO TABS
600.0000 mg | ORAL_TABLET | Freq: Once | ORAL | Status: AC
  Administered 2024-07-25: 600 mg via ORAL
  Filled 2024-07-25: qty 1

## 2024-07-25 MED ORDER — OXYCODONE-ACETAMINOPHEN 5-325 MG PO TABS: 1.0000 | ORAL_TABLET | Freq: Once | ORAL | Status: DC

## 2024-07-25 NOTE — Discharge Instructions (Signed)
 You have signs of a dental infection today.  I have given you information for a dentist to follow-up with as well as an antibiotic.  Please take the antibiotic for the entire course even if your symptoms improve.   Please use Tylenol  or ibuprofen  for pain.  You may use 600 mg ibuprofen  every 6 hours or 1000 mg of Tylenol  every 6 hours.  You may choose to alternate between the 2.  This would be most effective.  Not to exceed 4 g of Tylenol  within 24 hours.  Not to exceed 3200 mg ibuprofen  24 hours.    [facility] dental clinic -  [address] [place] - [place] [address] [phone]  [facility]) treats patients who are in the following groups:  Pediatrics with medicaid up to age 40 Patients over 57 with an orange card and a referral from a medical provider.  - If they are referred - there is a 40$ copay for any service - extration etc.  To get an orange card the following process is necessary.  Call the [facility] to apply for card - [phone] Once approved, you the patient will be set up with a medical provider Medical provider will refer patient to the [facility] clinic .  Offices accepting Medicaid patients:  Urgent tooth - [address] [place], [place] [address] [phone]  [facility] - [address], [place] [address] [phone]  [facility] (will see emergency dental patients from ED) - [address] [place], [place] [address] [phone]

## 2024-07-25 NOTE — ED Triage Notes (Signed)
 Patient complaining of right side dental pain after dental work done on 1/8. Denies fevers.

## 2024-07-25 NOTE — ED Provider Notes (Signed)
 " [facility] Provider Note   CSN: [id number] Arrival date & time: 07/25/24  9374     History   Chief Complaint Chief Complaint  Patient presents with   Dental Pain    HPI Rhonda Mann is a 49 y.o. female.  Patient presents to the emergency department with a dental complaint. Symptoms began several weeks ago and has worsened. The patient has tried to alleviate pain with nothing.  Pain rated as severe, characterized as throbbing in nature and located R side of face (upper and lower). Patient denies fever, night sweats, chills, difficulty swallowing or opening mouth, SOB, nuchal rigidity or decreased ROM of neck.  Patient does not have a dentist and requests a resource guide at discharge.  Patient had right upper and right lower wisdom teeth removed 1/8 and since then has had some pain and some swelling present Worse over the past few days.    HPI  Past Medical History:  Diagnosis Date   Anemia    Arthritis    Fibroid    Gall stones    Hypertension    Preterm labor    Smoker     Patient Active Problem List   Diagnosis Date Noted   Hypertension    Smoker     Past Surgical History:  Procedure Laterality Date   CESAREAN SECTION     CESAREAN SECTION WITH BILATERAL TUBAL LIGATION  2006   HERNIA REPAIR  2009   with mesh, done in [place]    INCISIONAL HERNIA REPAIR N/A 06/19/2023   Procedure: OPEN INCISIONAL HERNIA REPAIR;  Surgeon: Paola Dreama SAILOR, MD;  Location: [facility] OR;  Service: General;  Laterality: N/A;   LAPAROSCOPY  06/19/2023   Procedure: LAPAROSCOPY DIAGNOSTIC;  Surgeon: Paola Dreama SAILOR, MD;  Location: [facility] OR;  Service: General;;   TUBAL LIGATION       OB History     Gravida  5   Para  5   Term  5   Preterm  0   AB  0   Living  5      SAB  0   IAB  0   Ectopic  0   Multiple  0   Live Births  5            Home Medications    Prior to Admission medications  Medication Sig Start Date  End Date Taking? Authorizing Provider  amoxicillin -clavulanate (AUGMENTIN ) 875-125 MG tablet Take 1 tablet by mouth every 12 (twelve) hours for 14 days. 07/25/24 08/08/24 Yes Pansey Pinheiro S, PA  chlorhexidine  (PERIDEX ) 0.12 % solution Use as directed 15 mLs in the mouth or throat 2 (two) times daily. 07/25/24  Yes Glendine Swetz S, PA  ibuprofen  (ADVIL ) 600 MG tablet Take 1 tablet (600 mg total) by mouth every 6 (six) hours as needed. 07/25/24  Yes Kainoa Swoboda S, PA  ACCRUFER 30 MG CAPS Take 30 mg by mouth in the morning and at bedtime. 06/06/23   [provider]  clindamycin  (CLINDAGEL) 1 % gel Apply topically 2 (two) times daily. 07/05/24   Gladis Elsie BROCKS, PA-C  lisinopril (ZESTRIL) 40 MG tablet Take 40 mg by mouth daily. 01/12/24   [provider]  norethindrone  (MICRONOR ) 0.35 MG tablet Take 1 tablet (0.35 mg total) by mouth daily. Patient not taking: Mann sig reported 08/06/20   Cleotilde Ronal RAMAN, MD    Family History Family History  Problem Relation Age of Onset  Cancer Father    Hypertension Father     Social History Social History[1]   Allergies   Patient has Mann known allergies.   Review of Systems Denies fevers, chills, difficulty swallowing or eating, changes in voice, pain under tongue, nausea, vomiting, lightheadedness or dizziness. Mann trismus   Physical Exam Updated Vital Signs BP (!) 143/89 (BP Location: Right Arm)   Pulse 76   Temp 98.2 F (36.8 C) (Oral)   Resp 18   Ht 5' 8 (1.727 m)   Wt 80.7 kg   SpO2 97%   BMI 27.05 kg/m   Physical Exam Physical Exam  Constitutional: Pt appears well-developed and well-nourished.  HENT:  Head: Normocephalic.  Right Ear: Tympanic membrane, external ear and ear canal normal.  Left Ear: Tympanic membrane, external ear and ear canal normal.  Nose: Nose normal. Right sinus exhibits Mann maxillary sinus tenderness and Mann frontal sinus tenderness. Left sinus exhibits Mann maxillary sinus tenderness and Mann frontal  sinus tenderness.  Mouth/Throat: Uvula is midline, oropharynx is clear and moist and mucous membranes are normal. Mann oral lesions. Mann uvula swelling or lacerations. Mann oropharyngeal exudate, posterior oropharyngeal edema, posterior oropharyngeal erythema or tonsillar abscesses.  Poor dentition Mann gingival swelling, fluctuance or induration Mann sublingual edema, tenderness to palpation, or sign of Ludwig's angina, or deep space infection Pain at R upper and lower back teeth absent with lower wound oozing scant purulence with palpation. Mann trismus  Eyes: Conjunctivae are normal. Pupils are equal, round, and reactive to light. Right eye exhibits Mann discharge. Left eye exhibits Mann discharge.  Neck: Normal range of motion. Neck supple.  Mann stridor Handling secretions without difficulty Mann nuchal rigidity Mann cervical lymphadenopathy Cardiovascular: Normal rate, regular rhythm and normal heart sounds.   Pulmonary/Chest: Effort normal. Mann respiratory distress.  Equal chest rise  Abdominal: Soft. Bowel sounds are normal. Pt exhibits Mann distension. There is Mann tenderness.  Lymphadenopathy: Pt has Mann cervical adenopathy.  Neurological: Pt is alert and oriented x 4  Skin: Skin is warm and dry.  Psychiatric: Pt has a normal mood and affect.  Nursing note and vitals reviewed.   ED Treatments / Results  Labs (all labs ordered are listed, but only abnormal results are displayed) Labs Reviewed - Mann data to display  EKG    Radiology Mann results found.  Procedures Procedures (including critical care time)  Medications Ordered in ED Medications  amoxicillin -clavulanate (AUGMENTIN ) 875-125 MG per tablet 1 tablet (1 tablet Oral Given 07/25/24 0844)  ibuprofen  (ADVIL ) tablet 600 mg (600 mg Oral Given 07/25/24 0845)     Initial Impression / Assessment and Plan / ED Course  I have reviewed the triage vital signs and the nursing notes.  Pertinent labs & imaging results that were available during my  care of the patient were reviewed by me and considered in my medical decision making (see chart for details).        Patient with dentalgia.  Mann abscess requiring immediate incision and drainage.  Exam not concerning for Ludwig's angina or pharyngeal abscess.  Will treat with Augmentin  for 2 weeks and recommend very close dentist follow-up. Pt instructed to follow-up with dentist.  Discussed return precautions. Pt safe for discharge.   Final Clinical Impressions(s) / ED Diagnoses   Final diagnoses:  Dental infection    ED Discharge Orders          Ordered    amoxicillin -clavulanate (AUGMENTIN ) 875-125 MG tablet  Every 12 hours  07/25/24 0844    ibuprofen  (ADVIL ) 600 MG tablet  Every 6 hours PRN        07/25/24 0844    chlorhexidine  (PERIDEX ) 0.12 % solution  2 times daily        07/25/24 0846               [1]  Social History Tobacco Use   Smoking status: Every Day    Current packs/day: 1.00    Average packs/day: 0.8 packs/day for 51.1 years (38.6 ttl pk-yrs)    Types: Cigarettes    Start date: 2000   Smokeless tobacco: Never  Vaping Use   Vaping status: Never Used  Substance Use Topics   Alcohol use: Not Currently    Comment: occassionally every 3 months    Drug use: Never     Neldon Hamp RAMAN, [place] 07/25/24 1035  "

## 2024-07-26 ENCOUNTER — Encounter (HOSPITAL_COMMUNITY): Admission: RE | Payer: Self-pay | Source: Home / Self Care

## 2024-07-26 ENCOUNTER — Ambulatory Visit (HOSPITAL_COMMUNITY): Admission: RE | Admit: 2024-07-26 | Source: Home / Self Care | Admitting: Obstetrics and Gynecology

## 2024-07-26 DIAGNOSIS — Z01818 Encounter for other preprocedural examination: Secondary | ICD-10-CM

## 2024-08-05 ENCOUNTER — Ambulatory Visit (HOSPITAL_BASED_OUTPATIENT_CLINIC_OR_DEPARTMENT_OTHER): Admitting: Orthopaedic Surgery
